# Patient Record
Sex: Female | Born: 1960 | Race: Asian | Hispanic: No | Marital: Married | Smoking: Never smoker
Health system: Western US, Academic
[De-identification: ages and names within clinical notes are randomized; demographics above are authoritative.]

## PROBLEM LIST (undated history)

## (undated) ENCOUNTER — Ambulatory Visit (HOSPITAL_BASED_OUTPATIENT_CLINIC_OR_DEPARTMENT_OTHER): Admission: RE | Payer: TRICARE Prime—HMO | Admitting: Gastroenterology

## (undated) ENCOUNTER — Encounter (HOSPITAL_BASED_OUTPATIENT_CLINIC_OR_DEPARTMENT_OTHER): Admission: RE | Payer: Self-pay

## (undated) SURGERY — COLONOSCOPY
Anesthesia: Moderate Sedation - by non-anesthesia staff only

---

## 2017-04-29 ENCOUNTER — Encounter (INDEPENDENT_AMBULATORY_CARE_PROVIDER_SITE_OTHER): Payer: Self-pay | Admitting: Family Medicine

## 2017-04-29 ENCOUNTER — Ambulatory Visit (INDEPENDENT_AMBULATORY_CARE_PROVIDER_SITE_OTHER): Payer: TRICARE Prime—HMO | Admitting: Family Medicine

## 2017-04-29 ENCOUNTER — Encounter: Payer: Self-pay | Admitting: Hospital

## 2017-04-29 VITALS — BP 102/65 | HR 66 | Temp 97.6°F | Ht <= 58 in | Wt 110.2 lb

## 2017-04-29 DIAGNOSIS — Z1331 Encounter for screening for depression: Secondary | ICD-10-CM

## 2017-04-29 DIAGNOSIS — Z1339 Encounter for screening examination for other mental health and behavioral disorders: Secondary | ICD-10-CM

## 2017-04-29 DIAGNOSIS — Z1239 Encounter for other screening for malignant neoplasm of breast: Principal | ICD-10-CM

## 2017-04-29 DIAGNOSIS — M5441 Lumbago with sciatica, right side: Principal | ICD-10-CM

## 2017-04-29 DIAGNOSIS — G8929 Other chronic pain: Principal | ICD-10-CM

## 2017-04-29 DIAGNOSIS — M5416 Radiculopathy, lumbar region: Secondary | ICD-10-CM

## 2017-04-29 DIAGNOSIS — Z0000 Encounter for general adult medical examination without abnormal findings: Secondary | ICD-10-CM

## 2017-04-29 DIAGNOSIS — Z1389 Encounter for screening for other disorder: Secondary | ICD-10-CM

## 2017-04-29 DIAGNOSIS — M8580 Other specified disorders of bone density and structure, unspecified site: Secondary | ICD-10-CM

## 2017-04-29 MED ORDER — MECLIZINE HCL 25 MG OR TABS: 25.00 mg | ORAL_TABLET | Freq: Three times a day (TID) | ORAL | Status: AC | PRN

## 2017-04-29 MED ORDER — ESOMEPRAZOLE MAGNESIUM 40 MG OR CPDR: 40.00 mg | DELAYED_RELEASE_CAPSULE | Freq: Every day | ORAL | Status: AC

## 2017-04-29 MED ORDER — HYDROXYZINE HCL 25 MG OR TABS: 25.00 mg | ORAL_TABLET | Freq: Three times a day (TID) | ORAL | Status: AC | PRN

## 2017-04-29 MED ORDER — ACYCLOVIR 800 MG OR TABS: 800.00 mg | ORAL_TABLET | Freq: Two times a day (BID) | ORAL | Status: AC

## 2017-04-29 MED ORDER — NAPROXEN 500 MG OR TABS
500.00 mg | ORAL_TABLET | Freq: Two times a day (BID) | ORAL | Status: DC | PRN
Start: ? — End: 2017-12-03

## 2017-04-29 MED ORDER — ALPRAZOLAM 0.25 MG OR TABS: .25 mg | ORAL_TABLET | Freq: Every evening | ORAL | Status: AC | PRN

## 2017-04-29 MED ORDER — ACETAMINOPHEN-CODEINE #3 300-30 MG OR TABS
1.00 | ORAL_TABLET | ORAL | Status: DC | PRN
Start: ? — End: 2017-06-25

## 2017-04-29 MED ORDER — PREDNISONE 20 MG OR TABS
20.00 mg | ORAL_TABLET | Freq: Every day | ORAL | Status: DC
Start: 2017-04-29 — End: 2017-06-25

## 2017-04-29 MED ORDER — HYDROCODONE-ACETAMINOPHEN 10-325 MG OR TABS
1.00 | ORAL_TABLET | Freq: Four times a day (QID) | ORAL | Status: DC | PRN
Start: ? — End: 2017-06-25

## 2017-04-29 MED ORDER — NITROFURANTOIN MONOHYD MACRO 100 MG OR CAPS
100.00 mg | ORAL_CAPSULE | Freq: Two times a day (BID) | ORAL | Status: DC
Start: 2017-04-29 — End: 2017-06-25

## 2017-04-29 MED ORDER — PREDNISONE 20 MG OR TABS
20.00 mg | ORAL_TABLET | Freq: Every day | ORAL | Status: DC
Start: ? — End: 2017-04-29

## 2017-04-29 MED ORDER — NITROFURANTOIN MONOHYD MACRO 100 MG OR CAPS
100.00 mg | ORAL_CAPSULE | Freq: Two times a day (BID) | ORAL | Status: DC
Start: ? — End: 2017-04-29

## 2017-04-29 NOTE — Telephone Encounter (Signed)
Forwarding to our LVN team to file online. Thank you!    +++++++++++++++++++++++++++++++++++++    Disability insurance claim filed with form receipt number: [id number].    Receipt number   Dates for disability(to and from): 06/29/2016  -  10/28/2017    Is patient under your care for this medical problem?  Yes.    Are you presently treating for this illness/injury?  Yes.     Treatment Intervals (weekly, monthly etc)?  2 times per week    Was patient seen by another MD or facility?  Yes.     First date of TX: 07/29/2016    Diagnosis: Chronic Lower Back Pain    ICD code: M54.41, G89.29    Permanent or Temporary?  Temporary    Any secondary diagnosis and code?  Chronic Radicular pain of lower back; M54.16; G89.29    Date hospitalized & Discharged?  No    Pt able to work prior to injury/ilness?  Yes.    Condition aggravated by pt's regular work?   Yes.     Is this for recommendation of drug or alchohol tx?  No.     Date pt became a resident of a treatment facility?  n/a    Would disclosure of this information be medically or psychologically detrimental to pt?  no    Pregnancy related?  no    Date of Delivery: no    EDD Receipt #: no    Vaginal or C-section?  no    Complicated/uncomplicated?  no    What day do you plan to start your disability?  04/29/2017    When do you plan to return to work?  10/29/2017

## 2017-04-29 NOTE — Telephone Encounter (Signed)
From: Marlon Peluyet Duong Lawley  To: Delbert PhenixLee, Robert Y, MD  Sent: 04/29/2017 11:39 AM PST  Subject: 20-Other    Disability insurance claim filed with form receipt number: [id number].

## 2017-04-29 NOTE — Patient Instructions (Addendum)
Please file disability on line and send us the Receipt Number/Claim Number.      Please get fasting blood tests at your convenience.

## 2017-04-29 NOTE — Progress Notes (Signed)
SUBJECTIVE:  Gail Lewis is a 57 year old female with chief complaint of Establish Care    Patient Active Problem List   Diagnosis    Chronic right-sided low back pain with right-sided sciatica    Osteopenia, unspecified location     Here with daughter.  First visit to our office.  Major concern is chronic lower back pain.  Has had since 2011.  No inciting injury or trauma.   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  Recently moved from [place] to [facility].   Previous PCP in [place] ordered L-spine films and 08/07/2016 report showed:  "1. Stable multilevel mild lumbar spondylosis, especially L3 through L5, and isolated L5-S1 degenerative disc compression.    2. No progressive or new developing vertebral compression."    Compared to 12/14/2012 L-spine radiographs per the above report.    She was ordered PT in [place], but moved here to [place] before PT could be started. Requesting PT order.    Lower back pain is described as diffuse, waxes and wanes, radiating down R leg at times; worse with walking and bending. Denies any weakness. Denies any falls. Denies any trauma.   Has been rx'd Norco by previous PCP for breakthrough pain, but she rarely takes this (per daughter, still using a bottle rx'd and filled in 2013).  Denies saddle anesthesia. Denies LE weakness/numbness/parasthesias. Denies bladder/bowel incontinence or retention.    Per daughter, has h/o migraine HA's and has been rx'd Tylenol #3 by PCP in [place]. However, she is not taking Tyl #3's recently, but has them if needed.     PMhx, PShx, MED, ALL, Shx, Fhx reviewed and in EPIC    ROS: as mentioned above  Denies fever/chills/sweats.  Denies URI s/sx.  Denies CP/SOB/N//V/diaphoresis.  Denies palpitations/orthopnea  Denies abd pain/n/v/d/c.  Denies melena/hematochezia.  Denies dysuria/hematuria.  Denies swelling/rash.  Denies any new or worsening motor-sensory changes.  Denies unintentional wt loss. Denies night  sweats.    Future Appointments   Date Time Provider Department Center   05/08/2017  8:30 AM [facility] DRAW STATION [facility] DRAW [facility]   06/25/2017 10:00 AM Delbert Phenix, [facility]     Current Outpatient Medications on File Prior to Visit   Medication Sig Dispense Refill    acetaminophen-codeine (TYLENOL #3) 300-30 MG tablet Take 1 tablet by mouth every 4 hours as needed for Moderate Pain (Pain Score 4-6).      acyclovir (ZOVIRAX) 800 MG tablet Take 800 mg by mouth 2 times daily.      ALPRAZolam (XANAX) 0.25 MG tablet Take 0.25 mg by mouth nightly as needed for Insomnia.      esomeprazole (NEXIUM) 40 MG capsule Take 40 mg by mouth every morning (before breakfast).      HYDROcodone-acetaminophen (NORCO) 10-325 MG tablet Take 1 tablet by mouth every 6 hours as needed for Moderate Pain (Pain Score 4-6).      hydrOXYzine HCL (ATARAX) 25 MG tablet Take 25 mg by mouth 3 times daily as needed for Itching.      meclizine (ANTIVERT) 25 MG tablet Take 25 mg by mouth every 8 hours as needed for Dizziness.      naproxen (NAPROSYN) 500 MG tablet Take 500 mg by mouth 2 times daily as needed. Take with meals.      [DISCONTINUED] nitrofurantoin monohydrate (MACROBID) 100 MG capsule Take 100 mg by mouth 2 times daily.      nitrofurantoin monohydrate (  MACROBID) 100 MG capsule Take 1 capsule (100 mg) by mouth 2 times daily. As needed for UTI 28 capsule     [DISCONTINUED] predniSONE (DELTASONE) 20 MG tablet Take 20 mg by mouth daily.      predniSONE (DELTASONE) 20 MG tablet Take 1 tablet (20 mg) by mouth daily. As needed for severe environmental allergies       No current facility-administered medications on file prior to visit.      Allergies as of 04/29/2017    (No Known Allergies)       There is no immunization history on file for this patient.    OBJECTIVE:  Vitals:    04/29/17 0941   BP: 102/65   BP Location: Left arm   BP Patient Position: Sitting   BP cuff size: Regular   Pulse: 66   Temp: 97.6 F (36.4 C)   TempSrc:  Tympanic   SpO2: 98%   Weight: 50 kg (110 lb 3.7 oz)   Height: 4\' 10"  (1.473 m)     Estimated body mass index is 23.04 kg/m as calculated from the following:    Height as of this encounter: 4\' 10"  (1.473 m).    Weight as of this encounter: 50 kg (110 lb 3.7 oz).  Gen: NAD, pleasant, comfortable  HEENT: NC/AT, anicteric sclera, EOMI, PERRL, OP normal and clear; no LAD;   Neck: full AROM without problem; no LAD; no thyromegaly or masses;  CV: RRR, S1+S2+, no M/R/G/C; no carotid bruits;  Resp: CTAB; no wheezes/rales; good air exchange  Abd: soft; NT/ND  Spine: non-focal diffuse mild ttp of lower back  Neuro: Neg Straight Leg raise; DTR of patella and achilles +2 b/l and symmetric; sensation of LE grossly intact but mild decrease in light sensation on R lateral calf region and mid-plantar region of R foot; MS+5/5 of all major BLE muscle groups and symmetric;   UE: No edema; no erythema/ ecchymosis/cyanosis; cap refill <2sec; normal radial pulses 2+ & symmetric;  LE: No edema; no erythema/ ecchymosis/cyanosis; cap refill <2sec; normal DP and PT pulses 2+ & symmetric; Neg Straight Leg raise;     Outside 08/07/2016 L-spine XR (Compared to 12/14/2012 L-spine radiographs done outside [place]):   "1. Stable multilevel mild lumbar spondylosis, especially L3 through L5, and isolated L5-S1 degenerative disc compression.    2. No progressive or new developing vertebral compression."        ASSESSMENT/PLAN:  Gail Lewis was seen today for establish care.    Diagnoses and all orders for this visit:    Chronic right-sided low back pain with right-sided sciatica;   Chronic radicular pain of lower back  - XR report as stated above  -     Physical Therapy - Outside (Non-[place])  - disability paperwork done today (questionnaire below)   - f/u in 2 mo or sooner PRN    Screened negative for depression    Screened negative for alcohol use    Screened negative for drug use    Osteopenia, unspecified location  - outside [facility]  - cont wt bearing and vit  D+[place]++ supplementation    Annual physical exam  -     Comprehensive Metabolic Panel Green; Future  -     Lipid Panel Green Plasma Separator Tube; Future  -     CBC w/ Diff Lavender; Future  -     Glycosylated Hgb(A1C), Blood Lavender; Future      - f/u in 2 mo or sooner PRN  Patient Instruction:  See progress note.   Barriers to Learning assessed.  Patient and/or caregiver understanding of teaching and instructions verified.    Medication Review:  Medications reviewed with patient and/or caregiver and medication list reconciled.  Over the counter medications, herbal therapies and supplements reviewed.  Patient and/or caregiver understanding and response to medications assessed.    Barriers to medications assessed and addressed.   Risks, benefits, alternatives to medications reviewed.      Education and Case Management:  Goals of management reviewed with patient and/or caregiver.   Barriers to achieving goals reviewed and addressed with patient and/or caregiver.       Updated medication list:  Current Outpatient Medications   Medication Sig    acetaminophen-codeine (TYLENOL #3) 300-30 MG tablet Take 1 tablet by mouth every 4 hours as needed for Moderate Pain (Pain Score 4-6).    acyclovir (ZOVIRAX) 800 MG tablet Take 800 mg by mouth 2 times daily.    ALPRAZolam (XANAX) 0.25 MG tablet Take 0.25 mg by mouth nightly as needed for Insomnia.    esomeprazole (NEXIUM) 40 MG capsule Take 40 mg by mouth every morning (before breakfast).    HYDROcodone-acetaminophen (NORCO) 10-325 MG tablet Take 1 tablet by mouth every 6 hours as needed for Moderate Pain (Pain Score 4-6).    hydrOXYzine HCL (ATARAX) 25 MG tablet Take 25 mg by mouth 3 times daily as needed for Itching.    meclizine (ANTIVERT) 25 MG tablet Take 25 mg by mouth every 8 hours as needed for Dizziness.    naproxen (NAPROSYN) 500 MG tablet Take 500 mg by mouth 2 times daily as needed. Take with meals.    nitrofurantoin monohydrate (MACROBID) 100 MG capsule  Take 1 capsule (100 mg) by mouth 2 times daily. As needed for UTI    predniSONE (DELTASONE) 20 MG tablet Take 1 tablet (20 mg) by mouth daily. As needed for severe environmental allergies     No current facility-administered medications for this visit.        Spent greater than 50 min with pt with greater than 50% of time spent on counseling and face-to-face.        ++++++++++++++++++++++++++++++++++++++++++++  Receipt number   Dates for disability(to and from): 06/29/2016  -  10/28/2017    Is patient under your care for this medical problem?  Yes.    Are you presently treating for this illness/injury?  Yes.     Treatment Intervals (weekly, monthly etc)?  2 times per week    Was patient seen by another MD or facility?  Yes.     First date of TX: 07/29/2016    Diagnosis: Chronic Lower Back Pain    ICD code: M54.41, G89.29    Permanent or Temporary?  Temporary    Any secondary diagnosis and code?  Chronic Radicular pain of lower back; M54.16; G89.29    Date hospitalized & Discharged?  No    Pt able to work prior to injury/ilness?  Yes.    Condition aggravated by pt's regular work?   Yes.     Is this for recommendation of drug or alchohol tx?  No.     Date pt became a resident of a treatment facility?  n/a    Would disclosure of this information be medically or psychologically detrimental to pt?  no    Pregnancy related?  no    Date of Delivery: no    EDD Receipt #: no    Vaginal or C-section?  no    Complicated/uncomplicated?  no    What day do you plan to start your disability?  04/29/2017    When do you plan to return to work?  10/29/2017

## 2017-04-29 NOTE — Interdisciplinary (Signed)
Pre-visit chart review and huddle completed with staff and physician.    Outstanding labs, imaging and consults reviewed and identified.    Health maintanence issues identified and addressed:    Health Maintenance   Topic Date Due    IMM_TD/TDAP=>57 YO  12/22/1972    PHQ2 depression screen  12/23/1979    CERVICAL CANCER SCREENING  12/23/1982    BREAST CANCER SCREENING  12/22/2001    COLON CANCER SCREENING WITH COLONOSCOPY  12/23/2011    Hepatitis C Screening  12/22/2013    INFLUENZA VACCINE  10/29/2016

## 2017-04-29 NOTE — Telephone Encounter (Signed)
Pt had appointment this morning.  

## 2017-04-30 NOTE — Telephone Encounter (Signed)
Disability completed online  Receipt # [id number]

## 2017-05-08 ENCOUNTER — Other Ambulatory Visit: Payer: TRICARE Prime—HMO | Attending: Family Medicine

## 2017-05-08 DIAGNOSIS — Z0000 Encounter for general adult medical examination without abnormal findings: Secondary | ICD-10-CM | POA: Insufficient documentation

## 2017-05-08 LAB — COMPREHENSIVE METABOLIC PANEL, BLOOD
ALT (SGPT): 10 U/L (ref 0–33)
AST (SGOT): 24 U/L (ref 0–32)
Albumin: 4.2 g/dL (ref 3.5–5.2)
Alkaline Phos: 97 U/L (ref 35–140)
Anion Gap: 11 mmol/L (ref 7–15)
BUN: 17 mg/dL (ref 6–20)
Bicarbonate: 27 mmol/L (ref 22–29)
Bilirubin, Tot: 0.25 mg/dL (ref <1.2)
Calcium: 9.3 mg/dL (ref 8.5–10.6)
Chloride: 104 mmol/L (ref 98–107)
Creatinine: 0.76 mg/dL (ref 0.51–0.95)
GFR: >60 mL/min
Glucose: 92 mg/dL (ref 70–99)
Potassium: 4.1 mmol/L (ref 3.5–5.1)
Sodium: 142 mmol/L (ref 136–145)
Total Protein: 6.5 g/dL (ref 6.0–8.0)

## 2017-05-08 LAB — CBC WITH DIFF, BLOOD
ANC-Automated: 1.7 10*3/uL (ref 1.6–7.0)
Abs Basophils: 0 10*3/uL (ref <0.1)
Abs Eosinophils: 0.1 10*3/uL (ref 0.1–0.5)
Abs Lymphs: 1.4 10*3/uL (ref 0.8–3.1)
Abs Monos: 0.2 10*3/uL (ref 0.2–0.8)
Basophils: 1 %
Eosinophils: 4 %
Hct: 36.7 % (ref 34.0–45.0)
Hgb: 11.9 gm/dL (ref 11.2–15.7)
Lymphocytes: 40 %
MCH: 28.3 pg (ref 26.0–32.0)
MCHC: 32.4 g/dL (ref 32.0–36.0)
MCV: 87.4 um3 (ref 79.0–95.0)
MPV: 10 fL (ref 9.4–12.4)
Monocytes: 7 %
Plt Count: 211 10*3/uL (ref 140–370)
RBC: 4.2 10*6/uL (ref 3.90–5.20)
RDW: 12.7 % (ref 12.0–14.0)
Segs: 48 %
WBC: 3.5 10*3/uL — ABNORMAL LOW (ref 4.0–10.0)

## 2017-05-08 LAB — LIPID(CHOL FRACT) PANEL, BLOOD
Cholesterol: 205 mg/dL (ref <200)
HDL-Cholesterol: 50 mg/dL
LDL-Chol (Calc): 136 mg/dL (ref <160)
Non-HDL Cholesterol: 155 mg/dL
Triglycerides: 95 mg/dL (ref 10–170)

## 2017-05-08 LAB — GLYCOSYLATED HGB(A1C), BLOOD: Glyco Hgb (A1C): 5 % (ref 4.8–5.8)

## 2017-05-08 NOTE — Interdisciplinary (Signed)
Blood drawn from left arm with 23 gauge needle. 3 tubes taken.   Patient identity authenticated by Michelle Banco.

## 2017-05-11 ENCOUNTER — Other Ambulatory Visit: Payer: Self-pay

## 2017-05-13 ENCOUNTER — Encounter: Payer: Self-pay | Admitting: Hospital

## 2017-05-21 ENCOUNTER — Telehealth (INDEPENDENT_AMBULATORY_CARE_PROVIDER_SITE_OTHER): Payer: Self-pay | Admitting: Family Medicine

## 2017-05-21 NOTE — Telephone Encounter (Signed)
Fax rec'd from [facility] placed in your inbox for your review.

## 2017-05-22 NOTE — Telephone Encounter (Signed)
Faxed and sent to scan on 05/22/2017 at 1:19pm

## 2017-05-22 NOTE — Telephone Encounter (Signed)
Completed & Signed and in my outbox. Please fax/send back. Thank you!

## 2017-05-27 ENCOUNTER — Encounter (INDEPENDENT_AMBULATORY_CARE_PROVIDER_SITE_OTHER): Payer: Self-pay | Admitting: Family Medicine

## 2017-05-27 DIAGNOSIS — Z1239 Encounter for other screening for malignant neoplasm of breast: Principal | ICD-10-CM

## 2017-06-25 ENCOUNTER — Encounter: Payer: Self-pay | Admitting: Hospital

## 2017-06-25 ENCOUNTER — Other Ambulatory Visit: Payer: TRICARE Prime—HMO | Attending: Family Medicine

## 2017-06-25 ENCOUNTER — Telehealth (INDEPENDENT_AMBULATORY_CARE_PROVIDER_SITE_OTHER): Payer: Self-pay

## 2017-06-25 ENCOUNTER — Ambulatory Visit (INDEPENDENT_AMBULATORY_CARE_PROVIDER_SITE_OTHER): Payer: TRICARE Prime—HMO | Admitting: Family Medicine

## 2017-06-25 ENCOUNTER — Encounter (INDEPENDENT_AMBULATORY_CARE_PROVIDER_SITE_OTHER): Payer: Self-pay | Admitting: Family Medicine

## 2017-06-25 VITALS — BP 117/78 | HR 70 | Temp 96.9°F | Resp 16 | Ht <= 58 in | Wt 112.4 lb

## 2017-06-25 DIAGNOSIS — F411 Generalized anxiety disorder: Secondary | ICD-10-CM

## 2017-06-25 DIAGNOSIS — Z0000 Encounter for general adult medical examination without abnormal findings: Secondary | ICD-10-CM | POA: Insufficient documentation

## 2017-06-25 DIAGNOSIS — L309 Dermatitis, unspecified: Secondary | ICD-10-CM

## 2017-06-25 DIAGNOSIS — Z1211 Encounter for screening for malignant neoplasm of colon: Secondary | ICD-10-CM

## 2017-06-25 DIAGNOSIS — F320 Major depressive disorder, single episode, mild: Secondary | ICD-10-CM

## 2017-06-25 DIAGNOSIS — E7800 Pure hypercholesterolemia, unspecified: Secondary | ICD-10-CM

## 2017-06-25 DIAGNOSIS — Z1159 Encounter for screening for other viral diseases: Secondary | ICD-10-CM | POA: Insufficient documentation

## 2017-06-25 DIAGNOSIS — M5442 Lumbago with sciatica, left side: Principal | ICD-10-CM

## 2017-06-25 DIAGNOSIS — H9193 Unspecified hearing loss, bilateral: Secondary | ICD-10-CM

## 2017-06-25 DIAGNOSIS — G8929 Other chronic pain: Principal | ICD-10-CM

## 2017-06-25 DIAGNOSIS — Z23 Encounter for immunization: Secondary | ICD-10-CM

## 2017-06-25 LAB — HEPATITIS C AB, BLOOD: Hepatitis C Ab: NONREACTIVE

## 2017-06-25 MED ORDER — HYDROCORTISONE 1 % EX CREA
1.00 | TOPICAL_CREAM | Freq: Two times a day (BID) | CUTANEOUS | 1 refills | Status: DC | PRN
Start: 2017-06-25 — End: 2017-06-25

## 2017-06-25 MED ORDER — SERTRALINE HCL 25 MG OR TABS
25.0000 mg | ORAL_TABLET | Freq: Every day | ORAL | 3 refills | Status: DC
Start: 2017-06-25 — End: 2017-06-25

## 2017-06-25 MED ORDER — SERTRALINE HCL 25 MG OR TABS
25.00 mg | ORAL_TABLET | Freq: Every day | ORAL | 3 refills | Status: DC
Start: 2017-06-25 — End: 2018-02-04

## 2017-06-25 MED ORDER — HYDROCORTISONE 1 % EX CREA
1.00 | TOPICAL_CREAM | Freq: Two times a day (BID) | CUTANEOUS | 1 refills | Status: AC | PRN
Start: 2017-06-25 — End: ?

## 2017-06-25 NOTE — Patient Instructions (Addendum)
1) Please bring all your medication bottles with you to your next appointment with me.     2) Please return for a PAP smear.    3) Please make an appointment for a mammogram (breast cancer screening). Instructions below:   # Please call  Imaging Scheduling at [phone] to schedule your appointment at your earliest convenience. In order to obtain the most accurate insurance approval, we secure authorization after you are registered and scheduled for imaging services. Please have your insurance cards available when scheduling your exam.    4) Please get Lower Back Xray.          My name is Marcelino DusterMichelle,      It was a pleasure assisting you today. If you have any questions please feel free to call us at [phone].     "We are committed to making sure our patients receive the best care.  We mail and e-mail surveys randomly to patients after their visits.  If you happen to get one, we'd appreciate you taking a few minutes to fill it out to let us know what you liked best about your visit, and anything we can improve.  We use that information to make the care even better for you, as well as recognize staff members who do a great job."    Thank you for choosing  for your healthcare needs and have a great day!

## 2017-06-25 NOTE — Progress Notes (Signed)
SUBJECTIVE:  Gail Lewis is a 57 year old female with chief complaint of Follow Up (for back pain )    Patient Active Problem List   Diagnosis    Chronic right-sided low back pain with right-sided sciatica    Osteopenia, unspecified location     Second visit to our office.   Here for f/u back pain. Here today with husband.   Feels better after PT for 2-3 hours, but then lower back discomfort returns.   Radiates down LLE. Has about 2 session left of PT. Open to continuing PT if they recommend continuing with additional sessions.   Denies saddle anesthesia. Denies LE weakness/numbness/parasthesias. Denies bladder/bowel incontinence or retention.    2015 audiogram from [place], [place], showing hearing loss, purchased hearing aids 2 years ago. Sometimes uses it.   Desiring continuing care and evaluation at [place]. Denies any new s/sx. Denies dizziness/vertigo/HA/falls.    PMhx, PShx, MED, ALL, Shx, Fhx reviewed and in EPIC    ROS: as mentioned above  Denies fever/chills/sweats.  Denies URI s/sx.  Denies HA/lightheaded/dizziness.  Denies CP/SOB/N//V/diaphoresis.  Denies palpitations/orthopnea  Denies abd pain/n/v/d/c.  Denies melena/hematochezia.  Denies dysuria/hematuria.  Denies swelling/rash.  Denies visual changes/facial droop/confusion.  Denies focal weakness/numbness/parasthesias.  Denies any new or worsening motor-sensory changes.  Denies unintentional wt loss. Denies night sweats.    Future Appointments   Date Time Provider Department Center   07/07/2017  3:20 PM [facility] [address]   08/26/2017  2:00 PM Delbert PhenixLee, Tysheena Ginzburg Y[facility]   09/01/2017 10:00 AM Z[facility]     Current Outpatient Medications on File Prior to Visit   Medication Sig Dispense Refill    acyclovir (ZOVIRAX) 800 MG tablet Take 800 mg by mouth 2 times daily.      ALPRAZolam (XANAX) 0.25 MG tablet Take 0.25 mg by mouth nightly as needed for Insomnia.      esomeprazole (NEXIUM) 40 MG capsule Take 40  mg by mouth every morning (before breakfast).      hydrOXYzine HCL (ATARAX) 25 MG tablet Take 25 mg by mouth 3 times daily as needed for Itching.      meclizine (ANTIVERT) 25 MG tablet Take 25 mg by mouth every 8 hours as needed for Dizziness.      naproxen (NAPROSYN) 500 MG tablet Take 500 mg by mouth 2 times daily as needed. Take with meals.       No current facility-administered medications on file prior to visit.      Allergies as of 06/25/2017    (No Known Allergies)     Immunization History   Administered Date(s) Administered    Tdap 06/25/2017       OBJECTIVE:  Vitals:    06/25/17 1014   BP: 117/78   BP Location: Left arm   BP Patient Position: Sitting   BP cuff size: Regular   Pulse: 70   Resp: 16   Temp: 96.9 F (36.1 C)   TempSrc: Tympanic   SpO2: 97%   Weight: 51 kg (112 lb 6.4 oz)   Height: 4\' 10"  (1.473 m)     Estimated body mass index is 23.49 kg/m as calculated from the following:    Height as of this encounter: 4\' 10"  (1.473 m).    Weight as of this encounter: 51 kg (112 lb 6.4 oz).  Gen: NAD, pleasant, comfortable  HEENT: NC/AT  Neck: full AROM without problem; no LAD; no thyromegaly  or masses;  CV: RRR, S1+S2+, no M/R/G/C; no carotid bruits;  Resp: CTAB; no wheezes/rales; good air exchange  Abd: soft; NT/ND  Spine: no ttp; full AROM; + tight lumbar paraspinal muscles  UE: No edema; no erythema/ ecchymosis/cyanosis; cap refill <2sec; normal radial pulses 2+ & symmetric;  LE: No edema; no erythema/ ecchymosis/cyanosis; cap refill <2sec; normal DP and PT pulses 2+ & symmetric; neg straight leg raise; full AROM of all major joints; able to completely bend knees in baseball-catcher (or squatting) stance without problem; gait stable        Results for orders placed or performed in visit on 05/08/17   Glycosylated Hgb(A1C), Blood Lavender   Result Value Ref Range    Glyco Hgb (A1C) 5.0 4.8 - 5.8 %   CBC w/ Diff Lavender   Result Value Ref Range    WBC 3.5 (L) 4.0 - 10.0 1000/mm3    RBC 4.20 3.90 -  5.20 mill/mm3    Hgb 11.9 11.2 - 15.7 gm/dL    Hct 16.1 09.6 - 04.5 %    MCV 87.4 79.0 - 95.0 um3    MCH 28.3 26.0 - 32.0 pgm    MCHC 32.4 32.0 - 36.0 g/dL    RDW 40.9 81.1 - 91.4 %    MPV 10.0 9.4 - 12.4 fL    Plt Count 211 140 - 370 1000/mm3    Segs 48 %    Lymphocytes 40 %    Monocytes 7 %    Eosinophils 4 %    Basophils 1 %    ANC-Automated 1.7 1.6 - 7.0 1000/mm3    Abs Lymphs 1.4 0.8 - 3.1 1000/mm3    Abs Monos 0.2 0.2 - 0.8 1000/mm3    Abs Eosinophils 0.1 <0.1 - 0.5 1000/mm3    Abs Basophils 0.0 <0.1 1000/mm3    Diff Type Automated    Lipid Panel Green Plasma Separator Tube   Result Value Ref Range    Cholesterol 205 <200 mg/dL    HDL-Cholesterol 50 mg/dL    LDL-Chol (Calc) 782 <160 mg/dL    Non-HDL Cholesterol 155 mg/dL    Triglycerides 95 10 - 170 mg/dL   Comprehensive Metabolic Panel Green   Result Value Ref Range    Glucose 92 70 - 99 mg/dL    BUN 17 6 - 20 mg/dL    Creatinine 9.56 2.13 - 0.95 mg/dL    GFR >08 mL/min    Sodium 142 136 - 145 mmol/L    Potassium 4.1 3.5 - 5.1 mmol/L    Chloride 104 98 - 107 mmol/L    Bicarbonate 27 22 - 29 mmol/L    Anion Gap 11 7 - 15 mmol/L    Calcium 9.3 8.5 - 10.6 mg/dL    Total Protein 6.5 6.0 - 8.0 g/dL    Albumin 4.2 3.5 - 5.2 g/dL    Bilirubin, Tot 6.57 <1.2 mg/dL    AST (SGOT) 24 0 - 32 U/L    ALT (SGPT) 10 0 - 33 U/L    Alkaline Phos 97 35 - 140 U/L     The 10-year ASCVD risk score Denman George DC Jr., et al., 2013) is: 1.9%    Values used to calculate the score:      Age: 29 years      Sex: Female      Is Non-Hispanic African American: No      Diabetic: No      Tobacco smoker: No  Systolic Blood Pressure: 117 mmHg      Is BP treated: No      HDL Cholesterol: 50 mg/dL      Total Cholesterol: 205 mg/dL      Low Risk: 0 - 5 %  Moderate Risk: 5 - 7.5 %  High Risk: 7.5 - 100 %        ASSESSMENT/PLAN:  Gail Lewis was seen today for follow up.    Diagnoses and all orders for this visit:    Chronic left-sided low back pain with left-sided sciatica: improved but still present with  PT; to finish PT; obtain new XR; if persists, will consider MRI imaging  -     X-Ray Lumbosacral Spine 2 Or 3 Views    GAD (generalized anxiety disorder): well controlled; cont sertaline  -     sertraline (ZOLOFT) 25 MG tablet; Take 1 tablet (25 mg) by mouth daily.    Current mild episode of major depressive disorder, unspecified whether recurrent (CMS-HCC): well controlled; cont sertaline  -     sertraline (ZOLOFT) 25 MG tablet; Take 1 tablet (25 mg) by mouth daily.    Bilateral hearing loss, unspecified hearing loss type: h/o hearing aids; previously managed in [place]; will re-evaluate hearing per pt request  -     Audiology Clinic  -     Consult/Referral to Audiogram    Need for vaccination  -     Tdap (BOOSTRIX/ADACEL) for patients 43-10 years old    Colon cancer screening  -     GI Endoscopy Procedure Service Request (Non-GI Use Only); Future    Annual physical exam  -     GI Endoscopy Procedure Service Request (Non-GI Use Only); Future  -     Hepatitis C Antibody Yellow serum separator tube; Future  - to return for PAP screening  - mammogram ordered    Need for hepatitis C screening test  -     Hepatitis C Antibody Yellow serum separator tube; Future    Eczema, unspecified type: on-off eczema of neck region; no acute eczema today  -     hydrocortisone 1 % cream; Apply 1 Application topically 2 times daily as needed (neck rash). No longer than 2 weeks straight    Elevated cholesterol:   - Discussed diet & exercise at great length today and pt is agreeable and motivated      F/u in 2 mo for above or sooner PRN      Patient Instruction:  See progress note.   Barriers to Learning assessed.  Patient and/or caregiver understanding of teaching and instructions verified.    Medication Review:  Medications reviewed with patient and/or caregiver and medication list reconciled.  Over the counter medications, herbal therapies and supplements reviewed.  Patient and/or caregiver understanding and response to medications  assessed.    Barriers to medications assessed and addressed.   Risks, benefits, alternatives to medications reviewed.      Education and Case Management:  Goals of management reviewed with patient and/or caregiver.   Barriers to achieving goals reviewed and addressed with patient and/or caregiver.       Updated medication list:  Current Outpatient Medications   Medication Sig    acyclovir (ZOVIRAX) 800 MG tablet Take 800 mg by mouth 2 times daily.    ALPRAZolam (XANAX) 0.25 MG tablet Take 0.25 mg by mouth nightly as needed for Insomnia.    esomeprazole (NEXIUM) 40 MG capsule Take 40 mg by mouth every morning (before  breakfast).    hydrocortisone 1 % cream Apply 1 Application topically 2 times daily as needed (neck rash). No longer than 2 weeks straight    hydrOXYzine HCL (ATARAX) 25 MG tablet Take 25 mg by mouth 3 times daily as needed for Itching.    meclizine (ANTIVERT) 25 MG tablet Take 25 mg by mouth every 8 hours as needed for Dizziness.    naproxen (NAPROSYN) 500 MG tablet Take 500 mg by mouth 2 times daily as needed. Take with meals.    sertraline (ZOLOFT) 25 MG tablet Take 1 tablet (25 mg) by mouth daily.     No current facility-administered medications for this visit.

## 2017-06-25 NOTE — Interdisciplinary (Signed)
Verified Orders and vials/syringes for Immunizations/Medications prior to M.A. administration.

## 2017-06-25 NOTE — Telephone Encounter (Signed)
Received call from patient requesting to schedule colonoscopy. Patient received an order today 06/25/17 from Dr. Nedra HaiLee. Please advise for scheduling purposes. Thank you

## 2017-06-25 NOTE — Interdisciplinary (Signed)
Blood drawn from left arm with 21 gauge needle. 1 tubes taken.   Patient identity authenticated by DLV

## 2017-06-26 ENCOUNTER — Telehealth (INDEPENDENT_AMBULATORY_CARE_PROVIDER_SITE_OTHER): Payer: Self-pay | Admitting: Family Medicine

## 2017-06-26 NOTE — Telephone Encounter (Signed)
Patient came in with Husband for office visit and brought in prescriptions that were misplaced yesterday after giving them a 2nd copy. Sent prescriptions to shred.

## 2017-06-30 ENCOUNTER — Encounter: Payer: Self-pay | Admitting: Hospital

## 2017-06-30 ENCOUNTER — Telehealth (INDEPENDENT_AMBULATORY_CARE_PROVIDER_SITE_OTHER): Payer: Self-pay | Admitting: Family Medicine

## 2017-06-30 NOTE — Telephone Encounter (Signed)
Forms received from [facility] [place] (progress note) . Placed in providers inbox for review.

## 2017-07-01 NOTE — Telephone Encounter (Signed)
Triage: Colonoscopy, routine screening, any GI MD, LJ/HC

## 2017-07-02 NOTE — Telephone Encounter (Signed)
Called patient to assist with scheduling procedure. Left voicemail message for patient to return our call. If patient calls back please assist patient in scheduling procedure as triaged below. Thank You    Triage: Colonoscopy, routine screening, any GI MD, LJ/HC

## 2017-07-03 ENCOUNTER — Encounter (INDEPENDENT_AMBULATORY_CARE_PROVIDER_SITE_OTHER): Payer: Self-pay | Admitting: Family Medicine

## 2017-07-03 ENCOUNTER — Other Ambulatory Visit (INDEPENDENT_AMBULATORY_CARE_PROVIDER_SITE_OTHER): Payer: Self-pay | Admitting: Gastroenterology

## 2017-07-03 DIAGNOSIS — Z1211 Encounter for screening for malignant neoplasm of colon: Secondary | ICD-10-CM

## 2017-07-03 DIAGNOSIS — Z1239 Encounter for other screening for malignant neoplasm of breast: Principal | ICD-10-CM

## 2017-07-03 MED ORDER — PEG 3350-KCL-NABCB-NACL-NASULF 236 GM OR SOLR
4.0000 L | Freq: Once | ORAL | 0 refills | Status: AC
Start: 2017-07-03 — End: 2017-07-03

## 2017-07-03 NOTE — Telephone Encounter (Signed)
Bowel prep pended and routed to provider for review, Thank you.

## 2017-07-06 ENCOUNTER — Other Ambulatory Visit (INDEPENDENT_AMBULATORY_CARE_PROVIDER_SITE_OTHER): Payer: TRICARE Prime—HMO

## 2017-07-06 DIAGNOSIS — M8939 Hypertrophy of bone, multiple sites: Secondary | ICD-10-CM

## 2017-07-06 DIAGNOSIS — M5137 Other intervertebral disc degeneration, lumbosacral region: Secondary | ICD-10-CM

## 2017-07-07 ENCOUNTER — Ambulatory Visit (INDEPENDENT_AMBULATORY_CARE_PROVIDER_SITE_OTHER): Payer: TRICARE Prime—HMO

## 2017-07-13 ENCOUNTER — Other Ambulatory Visit (INDEPENDENT_AMBULATORY_CARE_PROVIDER_SITE_OTHER): Payer: Self-pay | Admitting: Gastroenterology

## 2017-07-14 ENCOUNTER — Encounter (INDEPENDENT_AMBULATORY_CARE_PROVIDER_SITE_OTHER): Payer: Self-pay | Admitting: Internal Medicine

## 2017-07-14 DIAGNOSIS — Z0000 Encounter for general adult medical examination without abnormal findings: Secondary | ICD-10-CM

## 2017-07-20 ENCOUNTER — Telehealth (INDEPENDENT_AMBULATORY_CARE_PROVIDER_SITE_OTHER): Payer: Self-pay | Admitting: Gastroenterology

## 2017-07-20 NOTE — Telephone Encounter (Signed)
Pre-procedure telephone call made. Left voice message with appointment date, time, and location. Patient will need transportation post procedure, will need to pick up bowel prep from the pharmacy (if applicable).  If any questions or needs to speak to a nurse, patient can call us back at [phone].

## 2017-07-31 ENCOUNTER — Encounter (INDEPENDENT_AMBULATORY_CARE_PROVIDER_SITE_OTHER): Payer: Self-pay | Admitting: Family Medicine

## 2017-07-31 DIAGNOSIS — Z1239 Encounter for other screening for malignant neoplasm of breast: Secondary | ICD-10-CM

## 2017-08-06 ENCOUNTER — Telehealth (INDEPENDENT_AMBULATORY_CARE_PROVIDER_SITE_OTHER): Payer: Self-pay | Admitting: Family Medicine

## 2017-08-06 NOTE — Telephone Encounter (Signed)
Epic HM updated.    Records reviewed. Please scan into EPIC. In my outbox.Thank you!

## 2017-08-06 NOTE — Telephone Encounter (Signed)
Records were received from providers outbox and placed in pile for scan. No further actions needed, closing encounter.

## 2017-08-06 NOTE — Telephone Encounter (Signed)
Fax received from From [facility] (Mammogram results). Placed in providers inbox for review/signature if indicated. Once completed please forward back to pool so we can finish encounter if needed.    Thank you!

## 2017-08-13 ENCOUNTER — Encounter (INDEPENDENT_AMBULATORY_CARE_PROVIDER_SITE_OTHER): Payer: TRICARE Prime—HMO | Admitting: Family Medicine

## 2017-08-26 ENCOUNTER — Encounter (INDEPENDENT_AMBULATORY_CARE_PROVIDER_SITE_OTHER): Payer: TRICARE Prime—HMO | Admitting: Family Medicine

## 2017-09-01 ENCOUNTER — Ambulatory Visit (INDEPENDENT_AMBULATORY_CARE_PROVIDER_SITE_OTHER): Payer: TRICARE Prime—HMO | Admitting: Audiology

## 2017-09-01 DIAGNOSIS — H903 Sensorineural hearing loss, bilateral: Secondary | ICD-10-CM

## 2017-09-02 ENCOUNTER — Encounter (INDEPENDENT_AMBULATORY_CARE_PROVIDER_SITE_OTHER): Payer: Self-pay | Admitting: Family Medicine

## 2017-09-02 ENCOUNTER — Ambulatory Visit (INDEPENDENT_AMBULATORY_CARE_PROVIDER_SITE_OTHER): Payer: TRICARE Prime—HMO | Admitting: Family Medicine

## 2017-09-02 VITALS — BP 105/67 | HR 53 | Temp 97.7°F | Resp 15 | Ht <= 58 in | Wt 105.6 lb

## 2017-09-02 DIAGNOSIS — B002 Herpesviral gingivostomatitis and pharyngotonsillitis: Secondary | ICD-10-CM

## 2017-09-02 DIAGNOSIS — M5441 Lumbago with sciatica, right side: Principal | ICD-10-CM

## 2017-09-02 DIAGNOSIS — G8929 Other chronic pain: Principal | ICD-10-CM

## 2017-09-02 MED ORDER — VALACYCLOVIR HCL 1000 MG OR TABS
ORAL_TABLET | ORAL | 3 refills | Status: AC
Start: 2017-09-02 — End: ?

## 2017-09-02 MED ORDER — GABAPENTIN 300 MG OR CAPS
300.0000 mg | ORAL_CAPSULE | Freq: Three times a day (TID) | ORAL | 3 refills | Status: AC
Start: 2017-09-02 — End: ?

## 2017-09-02 NOTE — Progress Notes (Signed)
SUBJECTIVE:  Gail Lewis is a 57 year old female with chief complaint of Follow Up (Chronic left-sided low back pain with left-sided sciatica )    Patient Active Problem List   Diagnosis    Chronic right-sided low back pain with right-sided sciatica    Osteopenia, unspecified location     Lower back pain is described as diffuse, waxes and wanes, radiating down R leg at times; worse with walking and bending. Denies any weakness. Denies any falls. Denies any trauma.   Has been rx'd Norco by previous PCP for breakthrough pain, but she rarely takes this (per daughter, still using a bottle rx'd and filled in 2013).  Denies saddle anesthesia. Denies LE weakness/numbness/parasthesias. Denies bladder/bowel incontinence or retention.    +++++++++++++++++++++++++++++++    Completed PT and still with symptoms.    07/06/2017 XR lumbar spine shows:  +++++++++++++++++++++++++++++++++++++++++++++  EXAM DESCRIPTION:  X-RAY LUMBOSACRAL SPINE MINIMUM 4 VIEWS    CLINICAL HISTORY:  Chronic lower back pain with radiation to left.    TECHNIQUE:  5 images.    COMPARISON:  None    FINDINGS:  There are 5 lumbar-type vertebral bodies.    Vertebral body heights are preserved. No acute fracture is seen. Spinal alignment is normal. There is mild multilevel degenerative disc disease, most marked at L5-S1. There is mild to moderate facet arthrosis at L4-L5 and L5-S1.    Bones are demineralized. Sacroiliac joints and pubic symphysis are congruent. Hip joint spaces are preserved.    CONCURRENT SUPERVISION:  I have reviewed the images and agree with the fellow's report.        Preliminary created by: Fuller PlanWilson, Lin   Signed by: Roanna RaiderHughes, Tudor 07/06/2017 12:20:10   Impression     IMPRESSION:  No acute osseous injury.    Mild multilevel degenerative changes of the lumbar spine, most marked at L5-S1.     +++++++++++++++++++++++++++++++++++++++++++++      PMhx, PShx, MED, ALL, Shx, Fhx reviewed and in EPIC    ROS: as mentioned above  Denies  fever/chills/sweats.  Denies URI s/sx.  Denies HA/lightheaded/dizziness.  Denies CP/SOB/N//V/diaphoresis.  Denies palpitations/orthopnea  Denies abd pain/n/v/d/c.  Denies melena/hematochezia.  Denies dysuria/hematuria.  Denies swelling. + cold sore on lip; h/o of this chronically; desires treatment options.  Denies any new or worsening motor-sensory changes.  Denies unintentional wt loss. Denies night sweats.    Future Appointments   Date Time Provider Department Center   11/18/2017  8:20 AM Delbert PhenixLee, Zula Hovsepian Y, MD [facility]     Current Outpatient Medications on File Prior to Visit   Medication Sig Dispense Refill    acyclovir (ZOVIRAX) 800 MG tablet Take 800 mg by mouth 2 times daily.      ALPRAZolam (XANAX) 0.25 MG tablet Take 0.25 mg by mouth nightly as needed for Insomnia.      esomeprazole (NEXIUM) 40 MG capsule Take 40 mg by mouth every morning (before breakfast).      hydrocortisone 1 % cream Apply 1 Application topically 2 times daily as needed (neck rash). No longer than 2 weeks straight 1 Tube 1    hydrOXYzine HCL (ATARAX) 25 MG tablet Take 25 mg by mouth 3 times daily as needed for Itching.      meclizine (ANTIVERT) 25 MG tablet Take 25 mg by mouth every 8 hours as needed for Dizziness.      naproxen (NAPROSYN) 500 MG tablet Take 500 mg by mouth 2 times daily as needed. Take with meals.  sertraline (ZOLOFT) 25 MG tablet Take 1 tablet (25 mg) by mouth daily. 90 tablet 3     No current facility-administered medications on file prior to visit.      Allergies as of 09/02/2017    (No Known Allergies)     Immunization History   Administered Date(s) Administered    Tdap 06/25/2017       OBJECTIVE:  Vitals:    09/02/17 1148   BP: 105/67   BP Location: Right arm   BP Patient Position: Sitting   BP cuff size: Regular   Pulse: 53   Resp: 15   Temp: 97.7 F (36.5 C)   TempSrc: Oral   SpO2: 98%   Weight: (!) 47.9 kg (105 lb 9.6 oz)   Height: 4\' 10"  (1.473 m)     Estimated body mass index is 22.07 kg/m as  calculated from the following:    Height as of this encounter: 4\' 10"  (1.473 m).    Weight as of this encounter: 47.9 kg (105 lb 9.6 oz).  Gen: NAD, pleasant, comfortable  HEENT: NC/AT, + lip cold sore  Neck: full AROM without problem  CV: RRR, S1+S2+, no M/R/G/C;  Resp: CTAB; no wheezes/rales; good air exchange  Abd: soft; NT/ND  Spine: o spinal tenderness; normal paraspinal muscles  Neuro: CN2-12 intact grossly; normal gait; MS +5/5 b/l; sensation grossly intact; DTR 2+ b/l; b/l straight leg raise  UE: No edema;  LE: No edema; no erythema/ ecchymosis/cyanosis; no skin lesions; no induration / fluctuance; no discharge; full AROM and PROM of hip, knee, ankle and toes; cap refill <2sec; normal DP and PT pulses 2+ & symmetric; sensation grossly intact; no bony ttp; no joint line ttp;     Results for orders placed or performed in visit on 06/25/17   Hepatitis C Antibody Yellow serum separator tube   Result Value Ref Range    Hepatitis C Ab Non Reactive        ASSESSMENT/PLAN:  Gail Lewis was seen today for follow up.    Diagnoses and all orders for this visit:    Chronic right-sided low back pain with right-sided sciatica: failed PT; XR completed and noted above; will obtain MRI and have pt see our pain management clinic for discussion of options for pain management; will start gabapentin trial and pt verbalizes desire to try  -     MRI Lumbar Spine W/O Contrast; Future  -     gabapentin (NEURONTIN) 300 MG capsule; Take 1 capsule (300 mg) by mouth 3 times daily.  -     Consult/Referral to Pain Clinic    Primary HSV infection of mouth: onset within 48 hr; discussed topical and supportive options; pt and husband would like to try PO medication; aware of risks and benefits and verbalizes understanding  -     valACYclovir (VALTREX) 1 GM tablet; Two tablets (2 grams) twice daily for 1 day within 48 hours of cold sore starting    F/u in ~2 mo and PRN before then    Patient Instruction:  See progress note.   Barriers to Learning  assessed.  Patient and/or caregiver understanding of teaching and instructions verified.    Medication Review:  Medications reviewed with patient and/or caregiver and medication list reconciled.  Over the counter medications, herbal therapies and supplements reviewed.  Patient and/or caregiver understanding and response to medications assessed.    Barriers to medications assessed and addressed.   Risks, benefits, alternatives to medications reviewed.  Education and Case Management:  Goals of management reviewed with patient and/or caregiver.   Barriers to achieving goals reviewed and addressed with patient and/or caregiver.       Updated medication list:  Current Outpatient Medications   Medication Sig    acyclovir (ZOVIRAX) 800 MG tablet Take 800 mg by mouth 2 times daily.    ALPRAZolam (XANAX) 0.25 MG tablet Take 0.25 mg by mouth nightly as needed for Insomnia.    esomeprazole (NEXIUM) 40 MG capsule Take 40 mg by mouth every morning (before breakfast).    gabapentin (NEURONTIN) 300 MG capsule Take 1 capsule (300 mg) by mouth 3 times daily.    hydrocortisone 1 % cream Apply 1 Application topically 2 times daily as needed (neck rash). No longer than 2 weeks straight    hydrOXYzine HCL (ATARAX) 25 MG tablet Take 25 mg by mouth 3 times daily as needed for Itching.    meclizine (ANTIVERT) 25 MG tablet Take 25 mg by mouth every 8 hours as needed for Dizziness.    naproxen (NAPROSYN) 500 MG tablet Take 500 mg by mouth 2 times daily as needed. Take with meals.    sertraline (ZOLOFT) 25 MG tablet Take 1 tablet (25 mg) by mouth daily.    valACYclovir (VALTREX) 1 GM tablet Two tablets (2 grams) twice daily for 1 day within 48 hours of cold sore starting     No current facility-administered medications for this visit.

## 2017-09-02 NOTE — Patient Instructions (Addendum)
1) Please continue home back exercises  2) Please obtain MRI  3) After MRI, please see the pain specialists (referral information below); call to make an appointment after you schedule the MRI

## 2017-09-03 NOTE — Progress Notes (Signed)
Audiologic Evaluation  Audiogram Viewable in Media    History  Patient referred by Rodena Medinobert Lee, MD, for an updated audiogram. Partial audiogram from outside clinic on 07/12/13 showed air conduction thresholds mild/moderate to profound at [phone] with no test results provided above 1000 Hz. Pt reports ear issues since childhood, was told she has holes in both ear drums and so wont benefit from hearing aids. Patient stated she has difficulty with understanding speech without facial cues, cannot hear alarms, cars behind her, birds or her phone ringing, and can only use her phone at full volume with her left ear. Patients primary language is [place].  She communicated well in English appointment with lip reading and speaking English with strong accent.      Results  Pure tone testing for the right ear showed a moderately-severe essentially sensorineural hearing loss 125-250, sloping from severe at 500 to profound hearing loss [phone], with no responses at 2000-8000 Hz. Left ear testing showed a moderately-severe sensorineural hearing loss 125-500, sloping from severe at 1000-2000 to profound (with no responses) at 3000-8000Hz  . Word recognition score was very poor bilaterally, with 36% for the right ear and 40% for the left ear. Note: word recognition was difficult to score strong accent.    Immittance testing showed a type A (normal) tympanogram for the right ear and type As (shallow compliance) for the left ear, with normal ear canal volume bilaterally (no perforation).    Impression  Results indicate bilateral asymmetrical moderately-severe to profound sensorineural hearing loss with right side15-30 dB poorer than left.  Compared to partial results from 07/12/13, right ear worsened by 25-30dB and left ear was within 10-20 dB.     This patient is expected to require visual cues for communication with very little audibility for average level speech. The patient uses speech reading very well, particularly for  [place].   Discussed possible benefits of hearing aids or cochlear implants and she indicated interest in hearing aid trial.     Patient may have benefit for hearing aids through [facility]. Explained we will submit a DME request to determine this. Patient would like to be called and left a voicemail with result.     Plan  Will submit DME request to insurance to determine hearing aid.  Patient to be called and left a voicemail regarding benefits  Patient will schedule hearing aid evaluation, hearing aid fitting, and follow-up appointments depending on insurance benefit.      Paulino Doorarlee Michaelson, BA  Audiology Doctoral Student  Leona SingletonErika Rajanae Mantia, PhD CCC-A   Clinical Professor, Audiology  Dispensing Audiologist [phone]

## 2017-09-04 ENCOUNTER — Encounter: Payer: Self-pay | Admitting: Hospital

## 2017-10-20 ENCOUNTER — Inpatient Hospital Stay (INDEPENDENT_AMBULATORY_CARE_PROVIDER_SITE_OTHER)
Admit: 2017-10-20 | Discharge: 2017-10-20 | Disposition: A | Discharge status: Home Health | Payer: TRICARE Prime—HMO | Attending: Family Medicine | Admitting: Family Medicine

## 2017-10-20 DIAGNOSIS — G8929 Other chronic pain: Principal | ICD-10-CM

## 2017-10-20 DIAGNOSIS — M5441 Lumbago with sciatica, right side: Principal | ICD-10-CM

## 2017-10-26 ENCOUNTER — Encounter (HOSPITAL_BASED_OUTPATIENT_CLINIC_OR_DEPARTMENT_OTHER): Payer: Self-pay | Admitting: Anesthesiology

## 2017-10-26 ENCOUNTER — Ambulatory Visit: Payer: TRICARE Prime—HMO | Attending: Anesthesiology | Admitting: Anesthesiology

## 2017-10-26 VITALS — BP 118/78 | HR 69

## 2017-10-26 DIAGNOSIS — G8929 Other chronic pain: Secondary | ICD-10-CM | POA: Insufficient documentation

## 2017-10-26 DIAGNOSIS — M5441 Lumbago with sciatica, right side: Secondary | ICD-10-CM | POA: Insufficient documentation
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
## 2017-11-18 ENCOUNTER — Encounter (INDEPENDENT_AMBULATORY_CARE_PROVIDER_SITE_OTHER): Payer: TRICARE Prime—HMO | Admitting: Family Medicine

## 2017-12-03 ENCOUNTER — Encounter (INDEPENDENT_AMBULATORY_CARE_PROVIDER_SITE_OTHER): Payer: Self-pay | Admitting: Family Medicine

## 2017-12-03 ENCOUNTER — Ambulatory Visit (INDEPENDENT_AMBULATORY_CARE_PROVIDER_SITE_OTHER): Payer: TRICARE Prime—HMO | Admitting: Family Medicine

## 2017-12-03 VITALS — BP 107/69 | HR 67 | Temp 98.1°F | Ht <= 58 in | Wt 105.6 lb

## 2017-12-03 DIAGNOSIS — M5386 Other specified dorsopathies, lumbar region: Secondary | ICD-10-CM

## 2017-12-03 NOTE — Progress Notes (Deleted)
Reviewed

## 2017-12-03 NOTE — Progress Notes (Deleted)
See Radene Gunning note.

## 2017-12-03 NOTE — Patient Instructions (Addendum)
Pain Department scheduling phone number:  [phone]. We look forward to serving your Pain needs.        ===============================================================================================

## 2017-12-03 NOTE — Interdisciplinary (Signed)
Pre-visit chart review and huddle completed with staff and physician.    Outstanding labs, imaging and consults reviewed and identified.    Health maintanence issues identified and addressed:    Health Maintenance   Topic Date Due   . CERVICAL CANCER SCREENING  12/23/1991   . COLON CANCER SCREENING WITH COLONOSCOPY  12/23/2011   . INFLUENZA VACCINE  12/29/2017   . PHQ2 depression screen  04/29/2018   . BREAST CANCER SCREENING  07/11/2018   . IMM_TD/TDAP=>11 YO  06/26/2027   . Hepatitis C Screening  Completed   . HPV Vaccine <= 26 Yrs  Aged Out

## 2017-12-06 MED ORDER — ACETAMINOPHEN 500 MG OR TABS
1000.00 mg | ORAL_TABLET | Freq: Two times a day (BID) | ORAL | 0 refills | Status: AC | PRN
Start: 2017-12-06 — End: ?

## 2017-12-06 NOTE — Progress Notes (Signed)
SUBJECTIVE:    Gail Lewis is a 57 year old female with   Chief Complaint   Patient presents with   . Follow Up     Chronic right-sided low back pain with right-sided sciatica     Here today with husband.     She reports bilateral low back pain and bilateral leg pain, which is still present since last visit.  Still describes the pain as persistent, with occasional shooting pain down the leg.  She says that she cannot stand for longer than 10 minutes at a time, cannot bend down.  She denies any numbness.   Lumbar MRI 10/20/17 showed compression of several nerve roots (bilateral L5, R-sided S1) due to spinal canal narowing and disc bulge.    She uses salonpas and gabapentin for pain management.   Says that gabapentin helps a little bit, but makes her too drowsy so she only takes one dose of gabapentin at night.  She does not use any other pain medications.  Has tried PT previously, but says that PT made her pain worse.  She was seen by Dr. Earlene Plater at the pain clinic 10/26/17.  She was offered lumbar epidural steroid injection at the time, but patient declined.     She says that pain in R leg has been present for many years, but only began experiencing L leg pain within the last 6 months.  Denies any urinary symptoms, including oliguria, urinary/bowel incontinence.  Endorses some nocturia, but unchanged from baseline.    Current Outpatient Medications   Medication Sig   . acyclovir (ZOVIRAX) 800 MG tablet Take 800 mg by mouth 2 times daily.   Marland Kitchen ALPRAZolam (XANAX) 0.25 MG tablet Take 0.25 mg by mouth nightly as needed for Insomnia.   Marland Kitchen esomeprazole (NEXIUM) 40 MG capsule Take 40 mg by mouth every morning (before breakfast).   . gabapentin (NEURONTIN) 300 MG capsule Take 1 capsule (300 mg) by mouth 3 times daily.   . hydrocortisone 1 % cream Apply 1 Application topically 2 times daily as needed (neck rash). No longer than 2 weeks straight   . hydrOXYzine HCL (ATARAX) 25 MG tablet Take 25 mg by mouth 3 times daily as  needed for Itching.   . meclizine (ANTIVERT) 25 MG tablet Take 25 mg by mouth every 8 hours as needed for Dizziness.   . sertraline (ZOLOFT) 25 MG tablet Take 1 tablet (25 mg) by mouth daily.   . valACYclovir (VALTREX) 1 GM tablet Two tablets (2 grams) twice daily for 1 day within 48 hours of cold sore starting     No current facility-administered medications for this visit.        Review of Systems:  Constitutional: Negative for weight changes, fevers, chills, sweats, headache, appetite  Cardiovascular: Negative for chest pain, palpitations  Respiratory: Negative for shortness of breath  Gastrointestinal: Negative for abdominal pain, nausea, vomiting, diarrhea, constipation  Genitourinary: See HPI  Neurological: Negative for numbness in LE.    OBJECTIVE:  BP 107/69 (BP Location: Right arm, BP Patient Position: Sitting, BP cuff size: Regular)   Pulse 67   Temp 98.1 F (36.7 C) (Oral)   Ht 4\' 10"  (1.473 m)   Wt (!) 47.9 kg (105 lb 9.6 oz)   LMP  (LMP Unknown)   SpO2 96%   Breastfeeding? No   BMI 22.07 kg/m   General: Appears stated age, siting comfortably in chair, not in acute distress  HEENT: NCAT, EOMI.  Cardiovascular: Regular rate and  rhythm.  Positive S1, S2.  No murmurs.  Pulmonary: Clear to auscultation bilaterally.  No wheezes, crackles, or rhonchi.  Abdominal: Soft, NTND, NAB.  No hepatosplenomegaly.  Back: R>L paraspinal tenderness; no spinal ttp;   Extremities: 4/5 strength in LE flexors and extensors, limited by pain. Decreased sensation to light touch in R L5/S1 and in R L3 dermatomal distributions but sensation in these areas are still present. Positive straight leg test bilaterally.  2+ patellar and achilles reflexes bilaterally.  No LE edema.  Neurologic: CN II-XII intact.      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 There is narrowing of both lateral recesses at this level with probable mild contact/compression of the bilateral traversing L5 nerve roots, asymmetric to the right.    Additional regions of traversing nerve root contact suspected on the right at L5-S1 where there is apparent contact of the traversing right S1 nerve root by a right asymmetric disc bulge at this level, although no definite compression or posterior displacement is seen.    Multilevel facet hypertrophy and various degrees of foraminal disc bulging results in to moderate-severe neural foraminal narrowing on the left at L5-S1 where there is likely compression of the exiting left L5 nerve root. Additional regions of nerve root contact suspected involving the exiting left L4, exited right L4 and exited right L5 nerve roots as described above.    Assessment and Plan:  Gail Lewis is a 57 year old female with chronic bilateral back pain c/b bilateral sciatica.    #Bilateral low back pain with bilateral sciatica  Chronic.  Still c/o bilateral back and leg pain, stable since last visit.  Exam notable for positive straight leg tests in both legs, and mildly decreased bilateral LE strength with decreased sensation to light touch in several dermatomal distributions on the R.  Etiology of sciatica likely 2/2 nerve root compression at level of L5, S1 as seen on MRI lumbar spine.  No urinary symptoms at this point c/f immediate surgical intervention.  Has completed PT with mild improvement. Gabapentin helps but gets drowsy with gabapentin so only takes in evening. She will try taking during the daytime.  Was offered ESI by pain clinic on 10/26/17 office visit, but declined at that time; after long discussion today, she may consider and she will inform her pain management specialist if she does decide to pursue ESI  - Trial acetaminophen 1000 mg bid PRN for pain  - To try gabapentin  300 mg tid as tolerated  - Patient will re-consider epidural steroid injection with pain clinic  - f/u in 3 mo or sooner PRN    Patient Instruction:  See progress note.   Barriers to Learning assessed.  Patient and/or caregiver understanding of teaching and instructions verified.    Medication Review:  Medications reviewed with patient and/or caregiver and medication list reconciled.  Over the counter medications, herbal therapies and supplements reviewed.  Patient and/or caregiver understanding and response to medications assessed.    Barriers to medications assessed and addressed.   Risks, benefits, alternatives to medications reviewed.      Education and Case Management:  Goals of management reviewed with patient and/or caregiver.   Barriers to achieving goals reviewed and addressed with patient and/or  caregiver.       Updated medication list:  Current Outpatient Medications   Medication Sig   . acetaminophen (TYLENOL) 500 MG tablet Take 2 tablets (1,000 mg) by mouth 2 times daily as needed for Mild Pain (Pain Score 1-3) or Moderate Pain (Pain Score 4-6).   Marland Kitchen acyclovir (ZOVIRAX) 800 MG tablet Take 800 mg by mouth 2 times daily.   Marland Kitchen ALPRAZolam (XANAX) 0.25 MG tablet Take 0.25 mg by mouth nightly as needed for Insomnia.   Marland Kitchen esomeprazole (NEXIUM) 40 MG capsule Take 40 mg by mouth every morning (before breakfast).   . gabapentin (NEURONTIN) 300 MG capsule Take 1 capsule (300 mg) by mouth 3 times daily.   . hydrocortisone 1 % cream Apply 1 Application topically 2 times daily as needed (neck rash). No longer than 2 weeks straight   . hydrOXYzine HCL (ATARAX) 25 MG tablet Take 25 mg by mouth 3 times daily as needed for Itching.   . meclizine (ANTIVERT) 25 MG tablet Take 25 mg by mouth every 8 hours as needed for Dizziness.   . sertraline (ZOLOFT) 25 MG tablet Take 1 tablet (25 mg) by mouth daily.   . valACYclovir (VALTREX) 1 GM tablet Two tablets (2 grams) twice daily for 1 day within 48 hours of cold sore starting      No current facility-administered medications for this visit.        Note co-written with Barbee Cough, MS4

## 2017-12-07 ENCOUNTER — Encounter (INDEPENDENT_AMBULATORY_CARE_PROVIDER_SITE_OTHER): Payer: Self-pay | Admitting: Family Medicine

## 2017-12-07 DIAGNOSIS — M5416 Radiculopathy, lumbar region: Secondary | ICD-10-CM

## 2017-12-08 DIAGNOSIS — M5416 Radiculopathy, lumbar region: Secondary | ICD-10-CM | POA: Insufficient documentation

## 2017-12-08 NOTE — Telephone Encounter (Signed)
1) Completed questions below.    2) Please contact pt to confirm the:  A) "Dates for Disability to and from,"   B) "What day do you plan to start your disability," and   C) "When do you plan to return to work,"   so it matches what they submitted. Thank you!      +++++++++++++++++++++++++++++++    Receipt number   Dates for disability(to and from): 12/08/2017 - 12/09/2018    Is patient under your care for this medical problem?  YES    Are you presently treating for this illness/injury?  YES    Treatment Intervals (weekly, monthly etc)? Monthly    Was patient seen by another MD or facility?  YES    First date of TX: 04/29/2017    Diagnosis: Multilevel Lumbar Nerve Root Compression    ICD code: M54.16    Permanent or Temporary?  Permanent    Any secondary diagnosis and code?  Sciatica associated with disorder of Lumbar Spine.  M53.86.    Date hospitalized & Discharged?  N/A    Pt able to work prior to injury/ilness?  Yes    Condition aggravated by pt's regular work?   Yes    Is this for recommendation of drug or alchohol tx?  No    Date pt became a resident of a treatment facility?  n/a    Would disclosure of this information be medically or psychologically detrimental to pt?  No    Pregnancy related?  no    Date of Delivery: N/A    EDD Receipt #:    Vaginal or C-section?  N/A    Complicated/uncomplicated?  N/A    What day do you plan to start your disability?  12/08/2017    When do you plan to return to work?  12/09/2018

## 2017-12-08 NOTE — Telephone Encounter (Signed)
Receipt number   Dates for disability(to and from):     Is patient under your care for this medical problem?      Are you presently treating for this illness/injury?      Treatment Intervals (weekly, monthly etc)?      Was patient seen by another MD or facility?      First date of TX:     Diagnosis:     ICD code:     Permanent or Temporary?      Any secondary diagnosis and code?      Date hospitalized & Discharged?      Pt able to work prior to injury/ilness?      Condition aggravated by pt's regular work?       Is this for recommendation of drug or alchohol tx?      Date pt became a resident of a treatment facility?      Would disclosure of this information be medically or psychologically detrimental to pt?     Pregnancy related?      Date of Delivery:    EDD Receipt #:    Vaginal or C-section?     Complicated/uncomplicated?      What day do you plan to start your disability?      When do you plan to return to work?

## 2017-12-08 NOTE — Telephone Encounter (Signed)
EDD completed patient notified via mychart 11/29/17-11/30/18    [id number]

## 2017-12-08 NOTE — Telephone Encounter (Signed)
From: Sissy Hoff Vicario  To: Delbert Phenix, MD  Sent: 12/07/2017 10:35 PM PDT  Subject: 20-Other    EDD disability application receipt number: [id number]

## 2017-12-17 ENCOUNTER — Encounter (INDEPENDENT_AMBULATORY_CARE_PROVIDER_SITE_OTHER): Payer: Self-pay | Admitting: Family Medicine

## 2017-12-17 ENCOUNTER — Telehealth (INDEPENDENT_AMBULATORY_CARE_PROVIDER_SITE_OTHER): Payer: Self-pay | Admitting: Audiology

## 2017-12-21 ENCOUNTER — Telehealth (INDEPENDENT_AMBULATORY_CARE_PROVIDER_SITE_OTHER): Payer: Self-pay | Admitting: Family Medicine

## 2017-12-21 NOTE — Telephone Encounter (Signed)
Husband of pt came in to drop off EDD form to be filled out by PCP.    Mr. Gail Lewis is asking if it can be mailed out he placed an envelope w/stamp inside.     Please contact phone number on file when completed, either sent out or filled out to be picked up.    Placed @ front bin.

## 2017-12-21 NOTE — Telephone Encounter (Signed)
Form placed in Dr. Marigene EhlersLee's inbox to review and complete.

## 2017-12-23 NOTE — Telephone Encounter (Signed)
Please contact pt and gather the following information from pt to adequately complete the disability paperwork.  Please send encounter back to me after these two questions are answered.  Thank you!    1) Dates for disability(to and from):   2) What day do you plan to start your disability? (Date needed)  3) When do you plan to return to work?  (Date needed)    Paperwork still in my inbox.    Thank you!    ++++++++++++++++++++++++++++++++++++++++++++++++    Receipt number   Dates for disability(to and from): NEEDED     Is patient under your care for this medical problem?  Yes    Are you presently treating for this illness/injury?  Yes    Treatment Intervals (weekly, monthly etc)? Monthly    Was patient seen by another MD or facility?  Yes    First date of TX: 04/29/2017    Diagnosis: Sciatica associated with disorder of lumbar spine    ICD code: M53.86    Permanent or Temporary?  Temporary    Any secondary diagnosis and code?  Chronic right-sided lower back pain with right-sided sciatica.  M54.41, G89.29    Date hospitalized & Discharged?  N/A    Pt able to work prior to injury/ilness?  Yes    Condition aggravated by pt's regular work?   Yes    Is this for recommendation of drug or alchohol tx?  No    Date pt became a resident of a treatment facility?  No    Would disclosure of this information be medically or psychologically detrimental to pt?  No    Pregnancy related?  No    Date of Delivery: N/A    EDD Receipt #:    Vaginal or C-section?  N/A    Complicated/uncomplicated?  N/A    What day do you plan to start your disability?  NEEDED    When do you plan to return to work?  DATE NEEDED

## 2017-12-25 ENCOUNTER — Encounter: Payer: Self-pay | Admitting: Hospital

## 2017-12-25 NOTE — Telephone Encounter (Signed)
First attempt:    I called patient and left message to call back. No personal information was left in message. Number left for patient to call back was [phone]     Sent patient a mychart message.

## 2017-12-29 NOTE — Telephone Encounter (Signed)
Who is calling: Incoming call from spouse [employer] Coverage Verified: Active- in network    Reason for this call: Patient spouse Reuel Boom called.  States returning call from Dr Nedra Hai nurse.    Nurse not able .  To please call back.      Action required by office: Please contact caller    Duplicate encounter? No previous documentation found on this issue.     Best way to contact: [phone]  Alternative: no    Inquiry has been read verbatim to this caller. Verbalizes satisfaction and confirms the above is accurate: yes      Has been advised this message will be transmitted to office and can expect a response within the next 24-72 hours.

## 2017-12-30 ENCOUNTER — Telehealth (INDEPENDENT_AMBULATORY_CARE_PROVIDER_SITE_OTHER): Payer: Self-pay | Admitting: Family Medicine

## 2017-12-30 NOTE — Telephone Encounter (Signed)
I tried calling the patient and was unable to get through and couldn't leave a voicemail. Will try again later.

## 2017-12-30 NOTE — Telephone Encounter (Signed)
Pts husband walked in to drop off a letter for Dr. Nedra Hai to read, he is asking for a response via my chart.      Placed @ front bin

## 2017-12-31 NOTE — Telephone Encounter (Signed)
Letter was dropped of from patients husband stating the following below    Dr. Nedra Hai   My wife became disabled around July 08 2016 to present due to ongoing issues with her lower back. She requests a present (re)-start date of Sept. 15th, 2019.   She has no specific return to work date as this is an ongoing issue . She is requesting the maximum time that you are willing to let her have. Thank you for your attention to this matter.     Letter was placed in your inbox for reference if needed.

## 2017-12-31 NOTE — Telephone Encounter (Signed)
Information from paper was documented on tel encounter dated 12/21/2017. Closing encounter

## 2018-01-01 NOTE — Telephone Encounter (Signed)
Please have one of our LVN staff (or other authorized staff) file the Disability Claim electronically & then update pt.   Thank you!    ++++++++++++++++++++++++++++++++++++++++++++++    Receipt number: CLAIM ID = [id number]    Dates for disability(to and from): 07/08/2016 to 03/31/2019    Is patient under your care for this medical problem?  Yes    Are you presently treating for this illness/injury?  Yes    Treatment Intervals (weekly, monthly etc)? Monthly    Was patient seen by another MD or facility?  Yes    First date of TX: 04/29/2017    Diagnosis: Sciatica associated with disorder of lumbar spine    ICD code: M53.86    Permanent or Temporary?  Temporary    Any secondary diagnosis and code?  Chronic right-sided lower back pain with right-sided sciatica.  M54.41, G89.29    Date hospitalized & Discharged?  N/A    Pt able to work prior to injury/ilness?  Yes    Condition aggravated by pt's regular work?   Yes    Is this for recommendation of drug or alchohol tx?  No    Date pt became a resident of a treatment facility?  No    Would disclosure of this information be medically or psychologically detrimental to pt?  No    Pregnancy related?  No    Date of Delivery: N/A    EDD Receipt #:    Vaginal or C-section?  N/A    Complicated/uncomplicated?  N/A    What day do you plan to start your disability?  12/13/2017    When do you plan to return to work?  04/01/2019

## 2018-01-03 ENCOUNTER — Encounter: Payer: Self-pay | Admitting: Hospital

## 2018-01-04 NOTE — Telephone Encounter (Signed)
EDD completed receipt number [id number]

## 2018-02-03 ENCOUNTER — Telehealth (INDEPENDENT_AMBULATORY_CARE_PROVIDER_SITE_OTHER): Payer: Self-pay | Admitting: Family Medicine

## 2018-02-03 DIAGNOSIS — F320 Major depressive disorder, single episode, mild: Secondary | ICD-10-CM

## 2018-02-03 DIAGNOSIS — F411 Generalized anxiety disorder: Secondary | ICD-10-CM

## 2018-02-03 NOTE — Telephone Encounter (Signed)
Patients husband requesting Rx for   sertraline (ZOLOFT) 25 MG tablet [[id number]]    Order Details   Dose: 25 mg Route: Oral Frequency: DAILY   Dispense Quantity: 90 tablet Refills: 3 Fills remaining: --         To be increased to 50 mg. Which was originally prescribed.    Please call husband when Rx is ready at   [facility]- [phone]

## 2018-02-04 MED ORDER — SERTRALINE HCL 50 MG OR TABS
50.00 mg | ORAL_TABLET | Freq: Every day | ORAL | 2 refills | Status: AC
Start: 2018-02-04 — End: ?

## 2018-02-04 NOTE — Telephone Encounter (Signed)
I called patient and advised her the Rx will be at the front for pick up and reminded her to schedule a follow-up. She will call back to schedule.

## 2018-02-04 NOTE — Telephone Encounter (Signed)
Increased dosage to Sertraline 50mg  QD  Rx printed and in my outbox.     Please have them make an appt with me in 1-2 months (40-min merge please).    Thank you!

## 2018-02-04 NOTE — Telephone Encounter (Signed)
Patients husband is requesting a change in dosage, please advise

## 2018-11-11 IMAGING — CR L-SPINE 2-3 VWS
1 series · 3 of 3 positions shown · non-contrast
Comparison: None

PROCEDURE: Lumbar spine 3 views November 11, 2018

INDICATION: Low back pain

[Series 1: ap · 0.17mm/px · 3 of 3 slices shown]
[im 1/3]
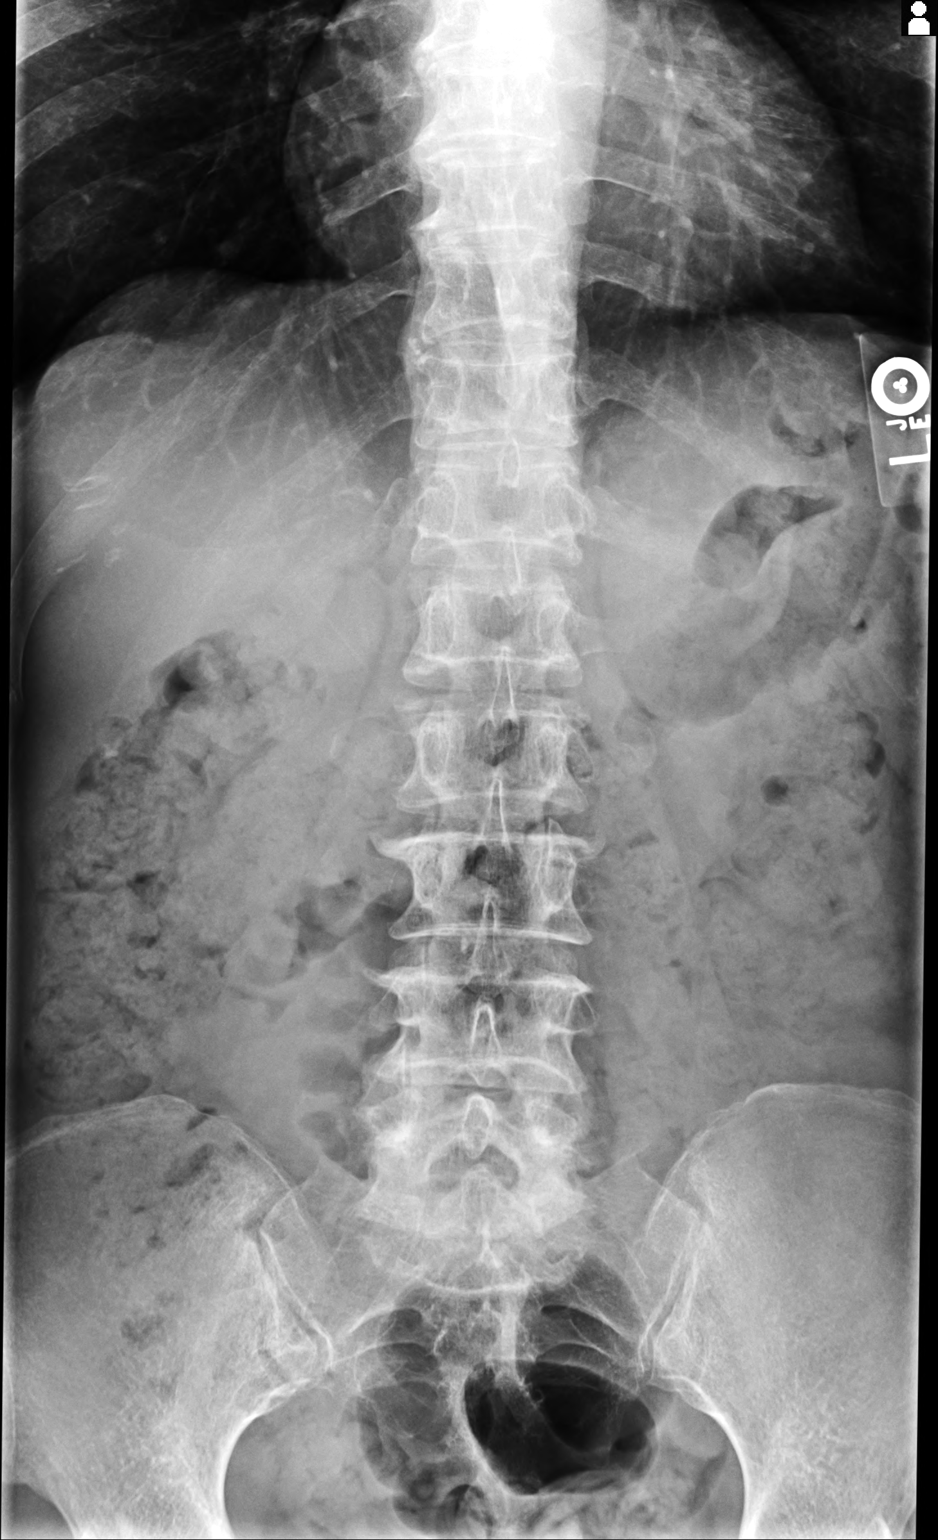
[im 2/3]
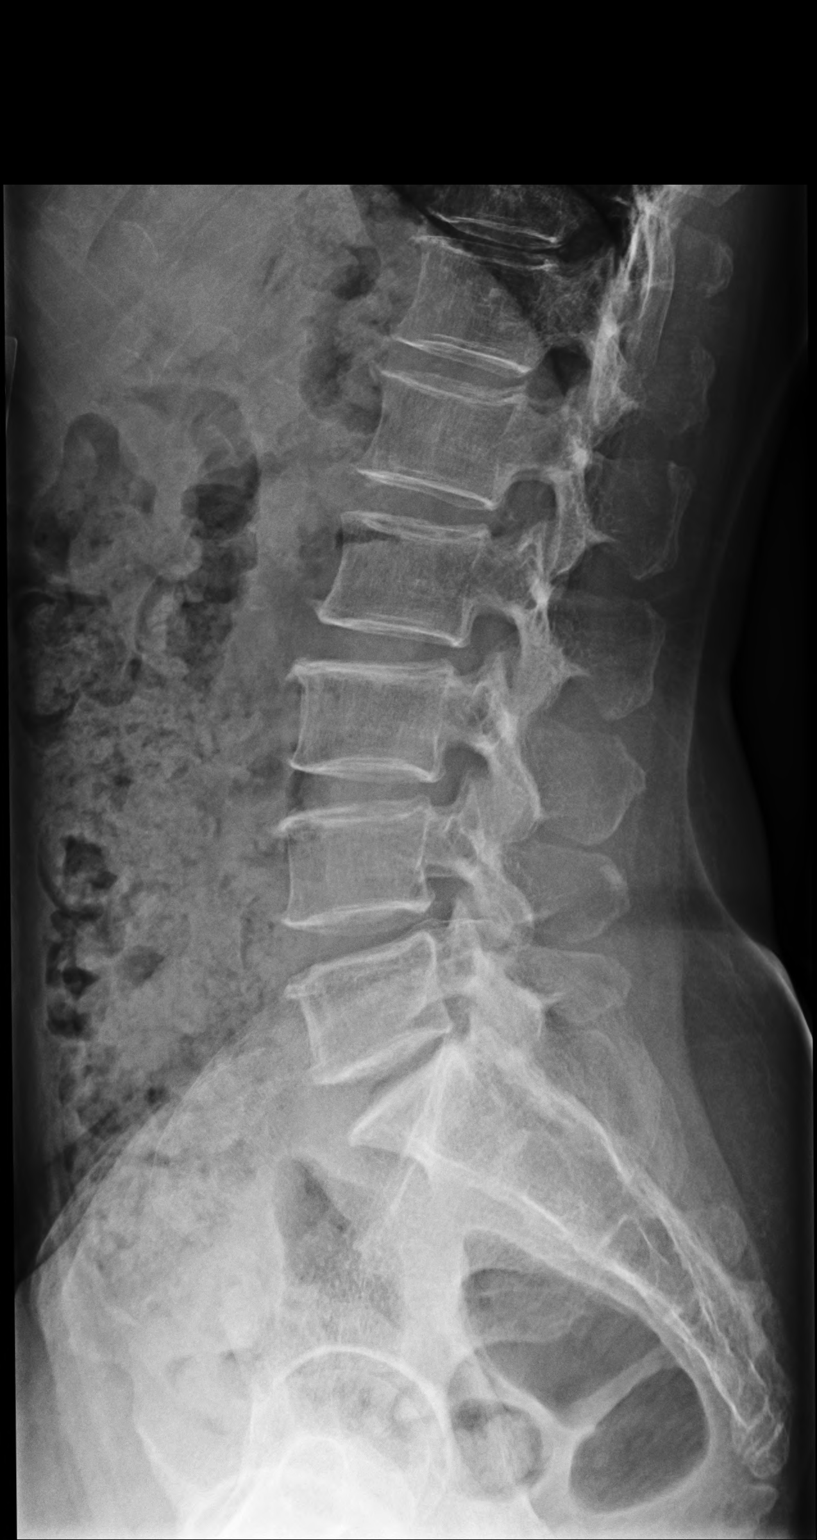
[im 3/3]
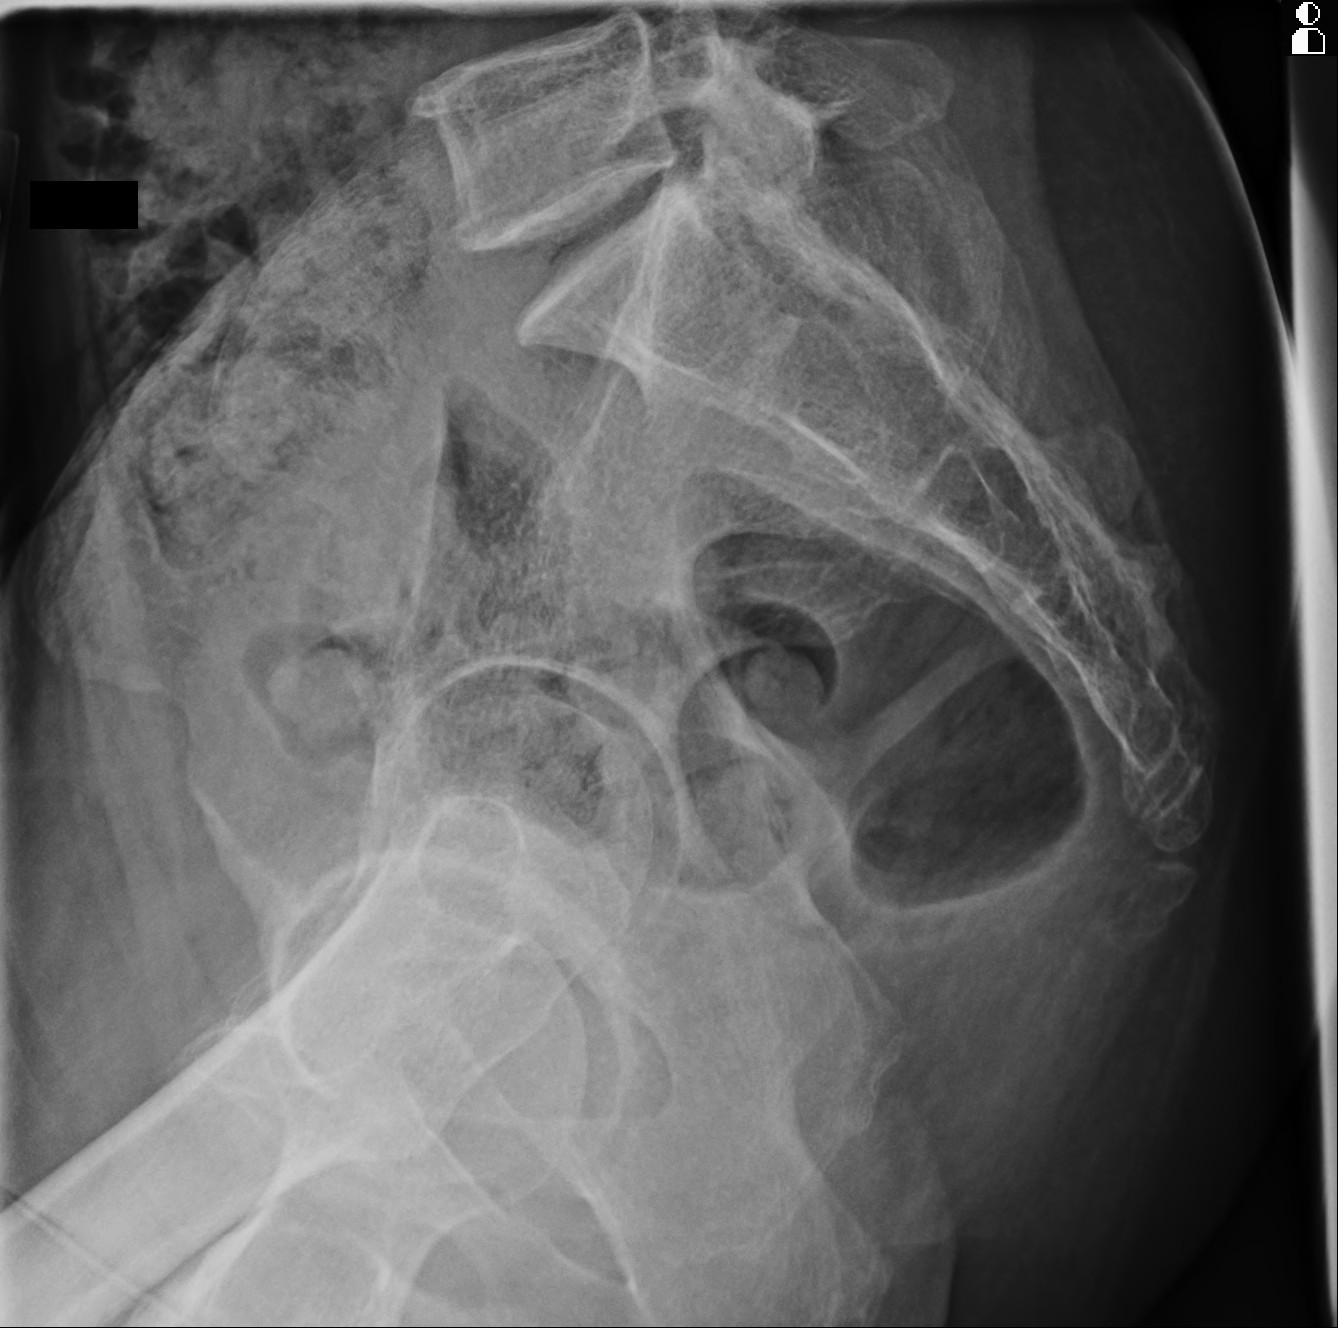

[3 of 3 positions shown; findings below may reference images not displayed]

FINDINGS: There are 5 lumbar-type vertebrae. Generalized decreased bone density with mild compression fracture deformity of the superior endplate of L3 and L4 vertebral bodies of indeterminate age. Mild decrease in disc space is seen in the visualized lower thoracic spine and at L5-S1 in keeping with degenerative disc disease. Small anterior and lateral osteophytes are seen throughout the lower thoracic and lumbar spine in keeping with spondylosis. Mild bilateral degenerative changes of the facets are seen at L5-S1.
IMPRESSION: 1. Generalized decreased bone density with mild compression fracture deformity of the superior endplate of L3 and L4 vertebral bodies of indeterminate age.

2. Spondylosis of the lower thoracic and lumbar spine with mild degenerative disc disease of the lower thoracic and lower lumbar spine.

3. Bilateral facet arthropathy seen at L5-S1.

## 2018-11-18 IMAGING — CR KNEE RT 3 VWS
1 series · 3 of 3 positions shown · non-contrast
Comparison: None available.

INDICATION: Pain to right knee
TECHNIQUE: 3 views of the right knee are obtained.

[Series 1: sun rise · 0.17mm/px · 3 of 3 slices shown]
[im 1/3]
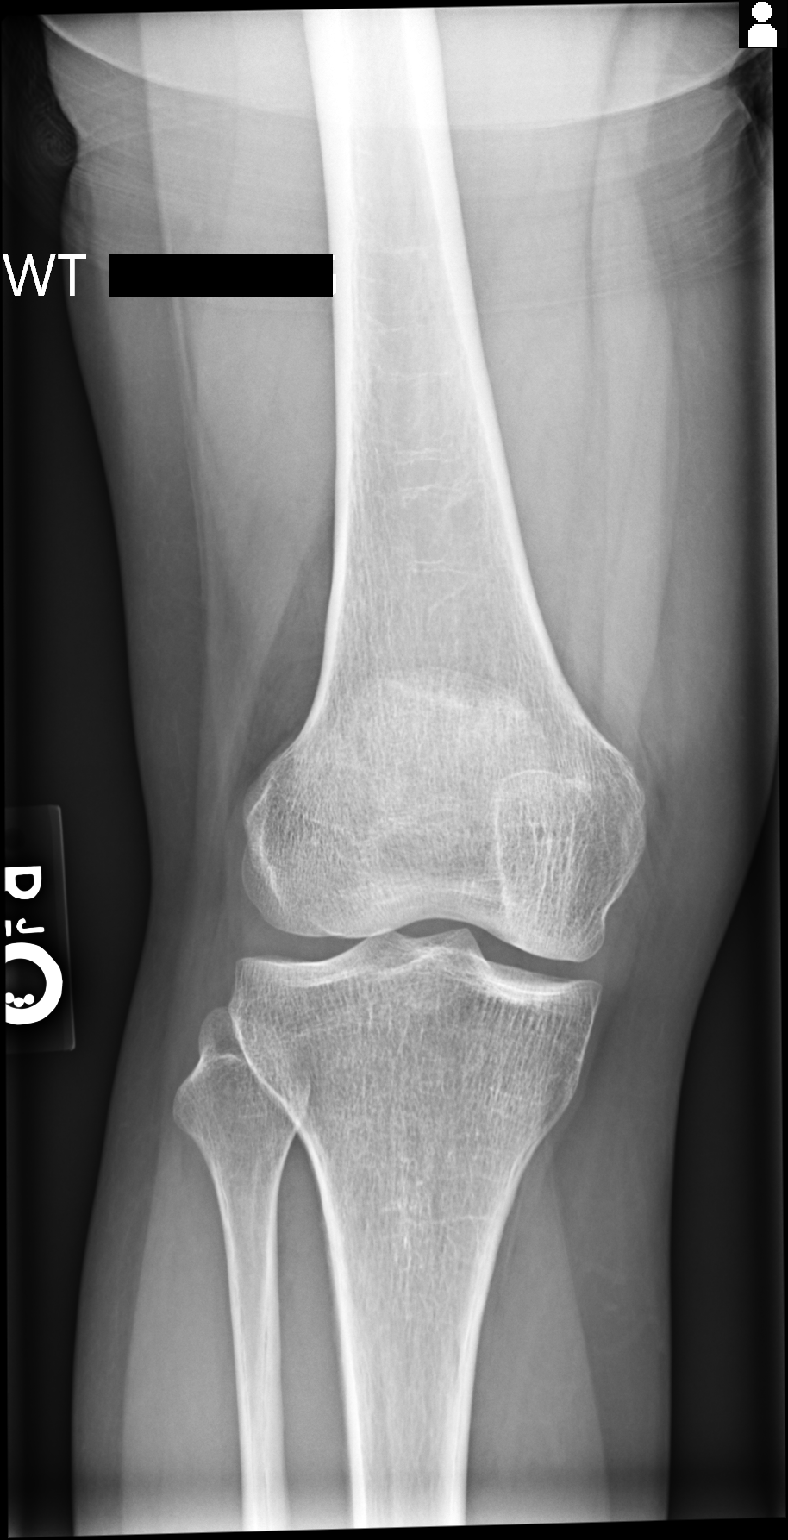
[im 2/3]
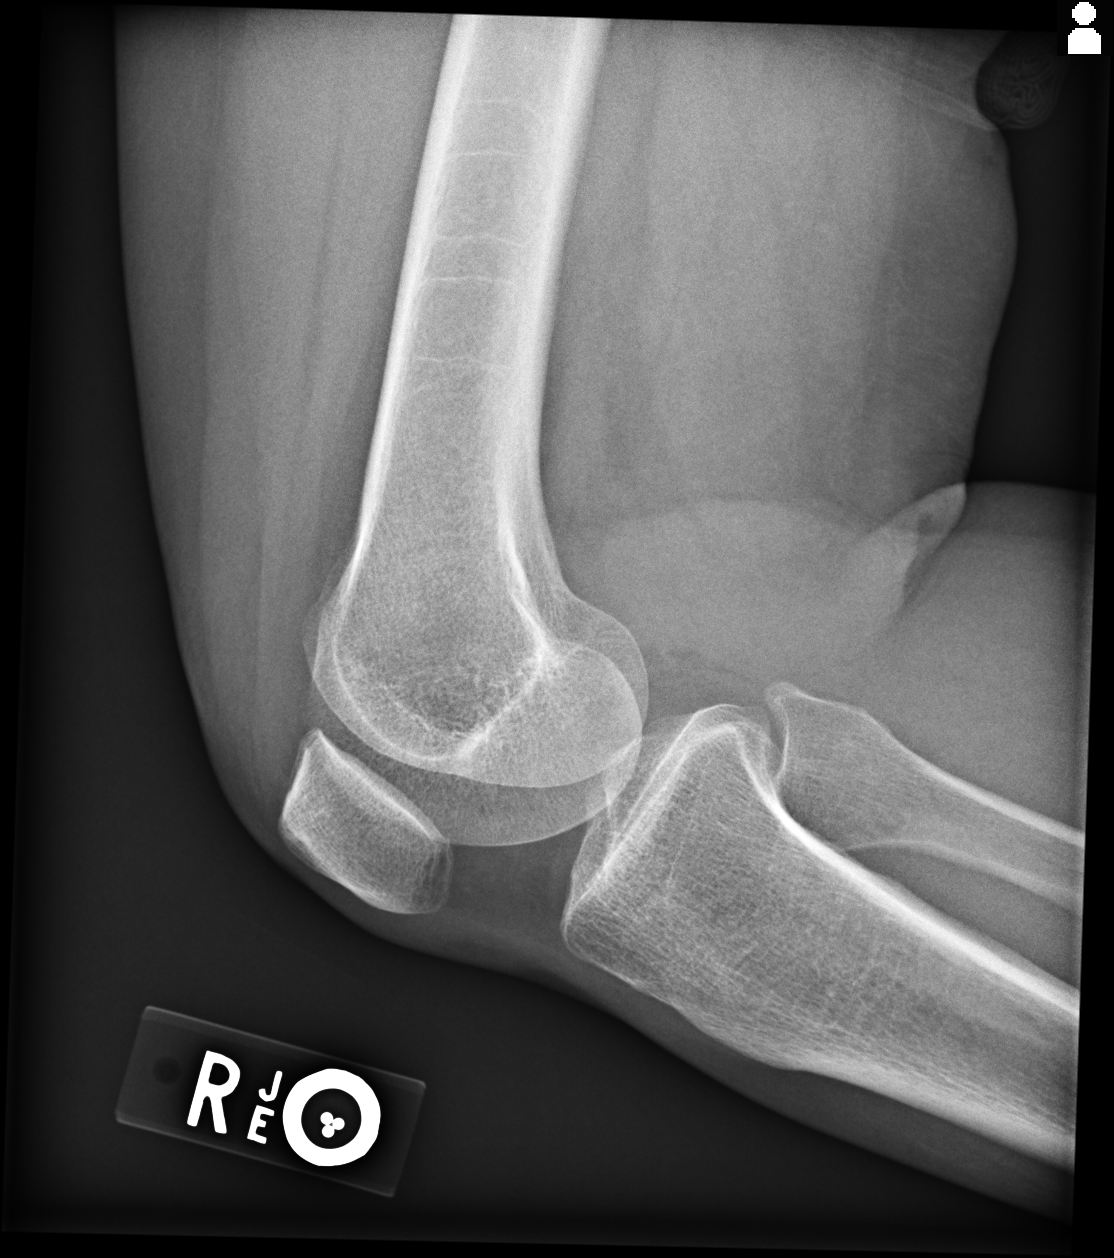
[im 3/3]
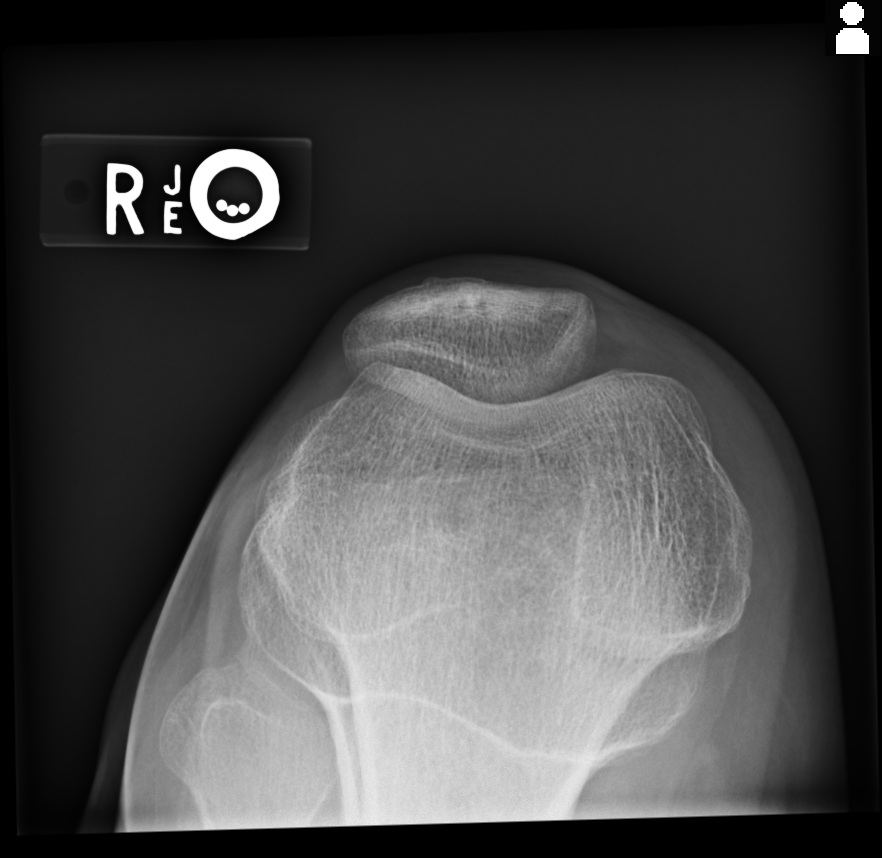

[3 of 3 positions shown; findings below may reference images not displayed]

FINDINGS: No acute fracture or dislocation. Normal mineralization. No erosions. Mild patellofemoral joint space narrowing. No joint effusion.
IMPRESSION: 1.
No acute fracture of the right knee.

2.
Mild patellofemoral joint space narrowing.

## 2018-11-22 IMAGING — OT DXA BONE DENSITY
2 series · 2 of 2 positions shown · non-contrast
Comparison: none

REASON FOR EXAM: Postmenopausal screening.

RISK FACTORS:  Low body weight (less than 127 pounds).
PRIOR EXAMS:  None.
METHOD:  Scans of the spine and hip were performed using dual energy X-ray densitometry (DXA) with the H[employer] Discovery C (S/EYVUKC) system.

[Series 1: — · 1 of 1 slices shown (1 of 2)]
[im 1/1]
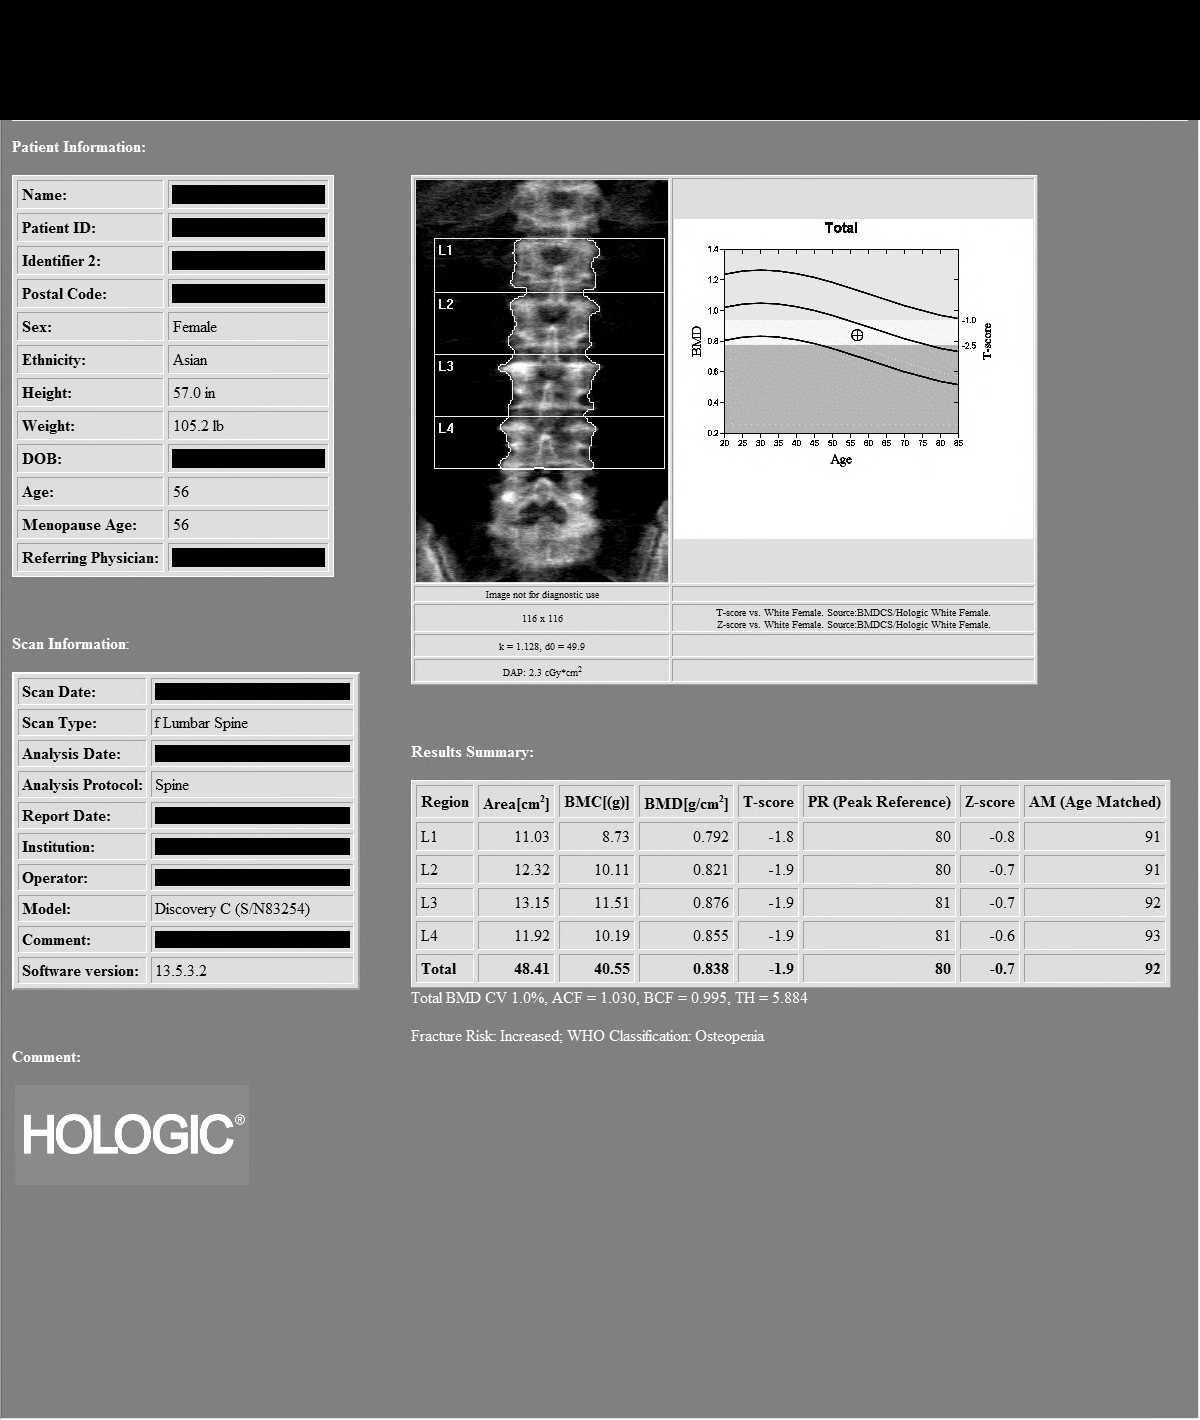

[Series 2: — · left · 1 of 1 slices shown (2 of 2)]
[im 1/1]
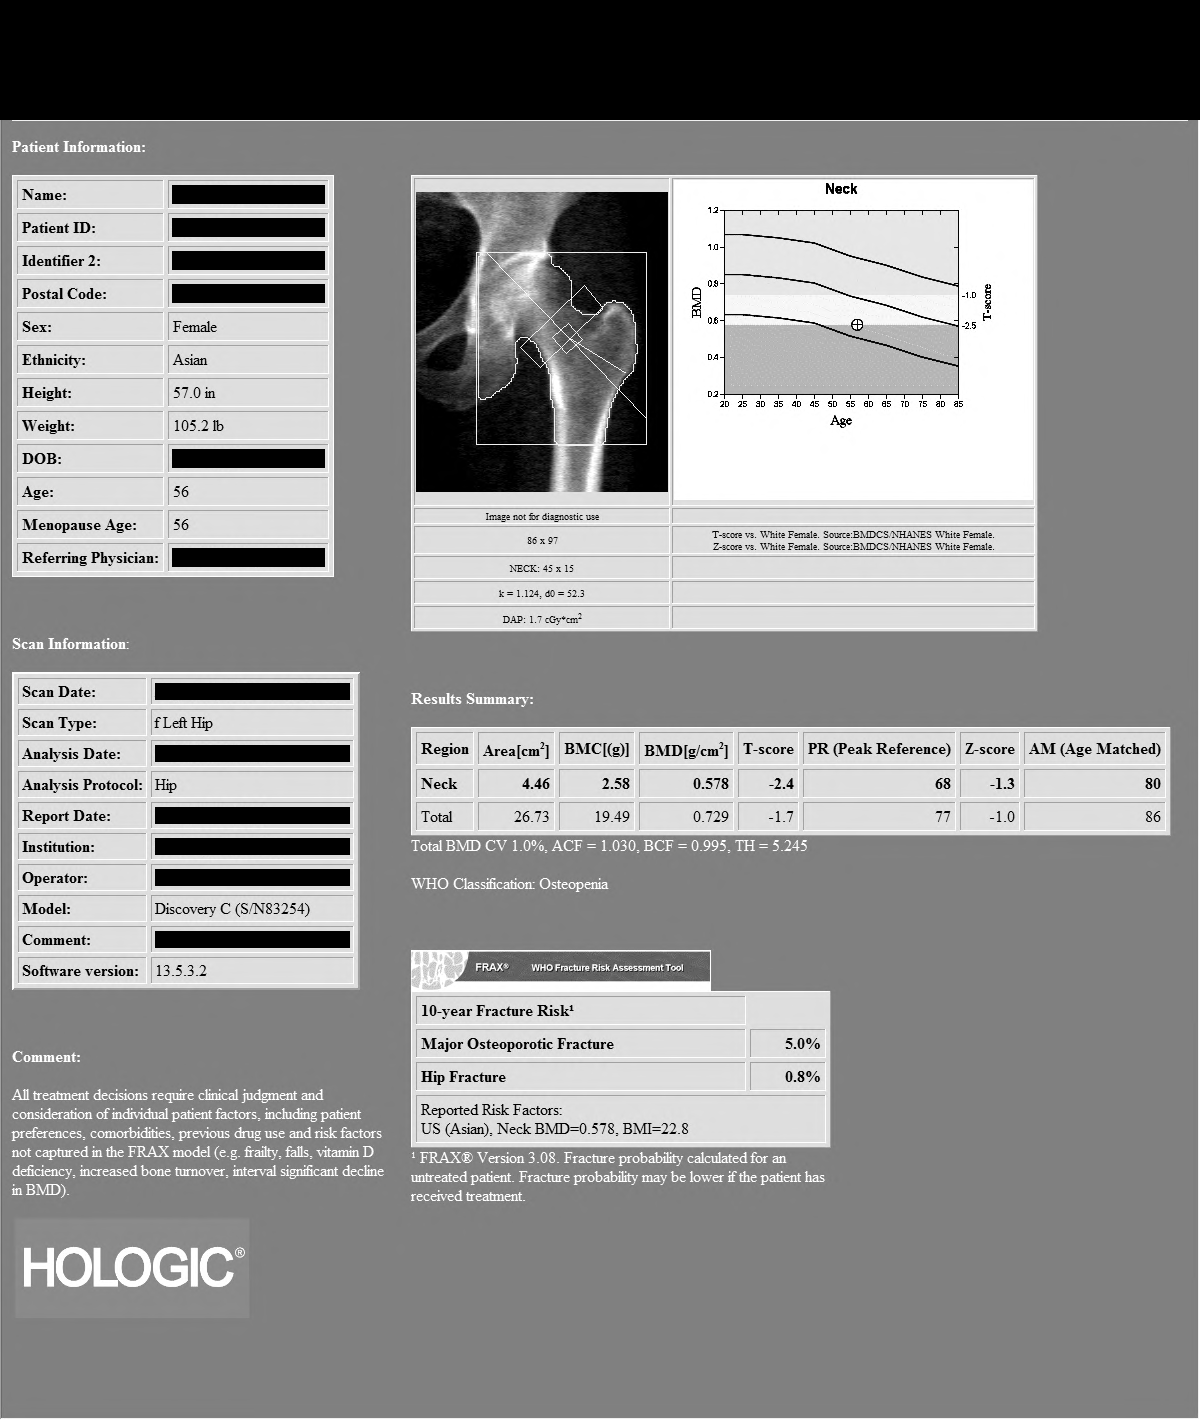

[2 of 2 positions shown; findings below may reference images not displayed]

IMPRESSION: As defined by World Health Organization, the patient meets the criteria for OSTEOPENIA based on lumbar spine and hip T-scores.

PATIENT DEMOGRAPHICS: 57-year-old Asian female.
FINDINGS: 1.    Review of scanogram images shows no factor invalidating scan results.  

2.    The lumbar spine exam using L1-L4 regions shows average Bone Mineral Density is 0.838 gm/cm2 of Hydroxyapatite.  The T-score (comparing patient with a young adult group) is 1.9 standard deviations BELOW mean. The Z-score (comparing patient with an age-matched group) is 0.7 standard deviations BELOW mean.

3.  The left hip exam using femoral neck region of interest shows average Bone Mineral Density is 0.578 gm/cm2 of Hydroxyapatite. The T-score (comparing patient with a young adult group) is 2.4 standard deviations BELOW mean. The Z-score (comparing patient with an age-matched group) is 1.3 standard deviations BELOW mean.

According to the World Health Organization risk assessment tool (FRAX) for osteopenia only, which uses the femoral neck T-score and includes other patient risk factors for fracture, the patient has a 10-year absolute risk of hip fracture of 0.8% and 10-year absolute risk fracture for any major fracture of 5.0%.

RECOMMENDATIONS: The patient states that she is not taking supplements on a regular basis and regular exercise to patient tolerance would be of benefit.  The patient is currently not taking prescribed medication for prevention of bone loss.  According to criteria established by the National Osteoporosis Foundation, the patient DOES NOT meet the current indications for prescribed medical therapy.  The National Osteoporosis Foundation now recommends followup DXA scanning every two years in patients at risk regardless of whether the patient is undergoing pharmacological treatment.

World Health Organization criteria for diagnosis, please see link below.

[URL]

## 2018-11-25 IMAGING — MR MRI LSPINE WO CONTRAST
4 of 5 series · 25 of 48 positions shown · IV contrast (gadolinium)
Comparison: Lumbar spine, 11/11/18 demonstrating 5 lumbarized segments.

HISTORY/INDICATION:  Radiculopathy, low back pain radiating to left leg
TECHNIQUE: Scans of the lumbar spine are performed in the sagittal and transaxial planes using T1, fast spin-echo T2 and STIR scan sequences without gadolinium.

[Series 2: t2_sag · sagittal · 4.0mm · 0.68mm/px · 6 of 15 slices shown]
[im 1/15]
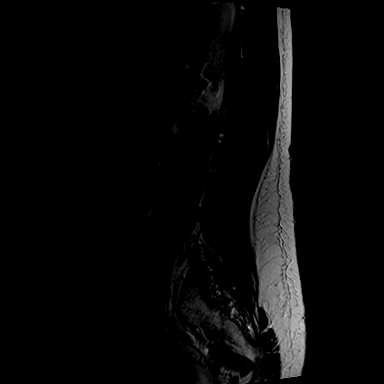
[im 3/15]
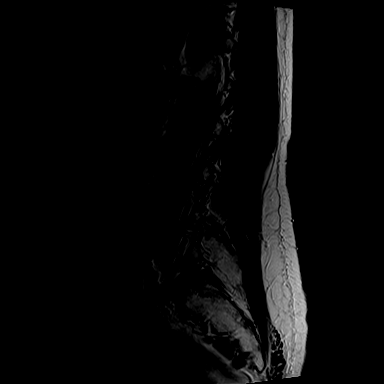
[im 6/15]
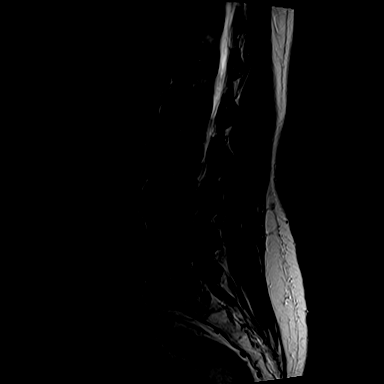
[im 9/15]
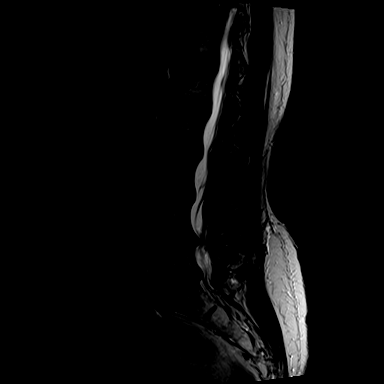
[im 12/15]
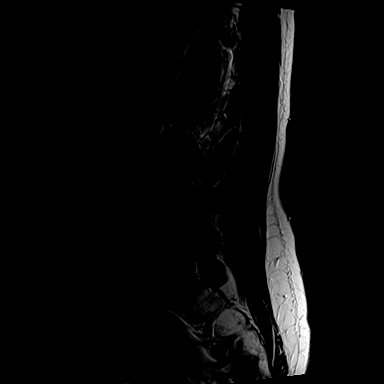
[im 15/15]
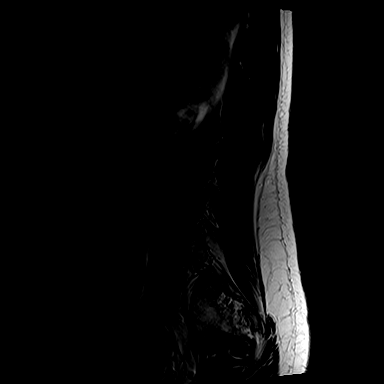

[Series 3: t1_sag · sagittal · 4.0mm · 0.81mm/px · 7 of 15 slices shown]
[im 1/15]
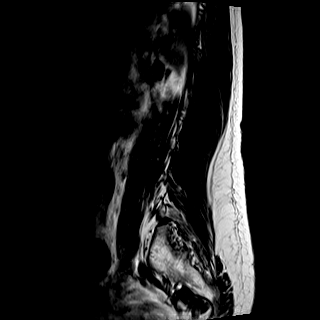
[im 3/15]
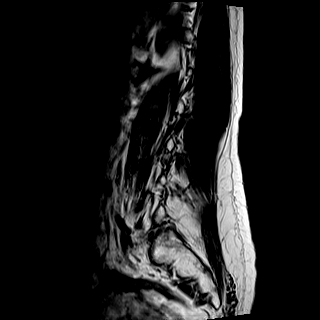
[im 5/15]
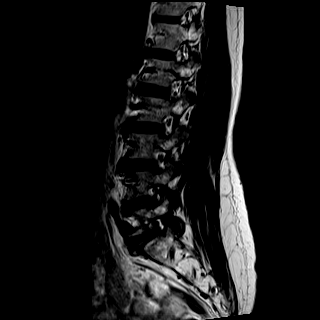
[im 8/15]
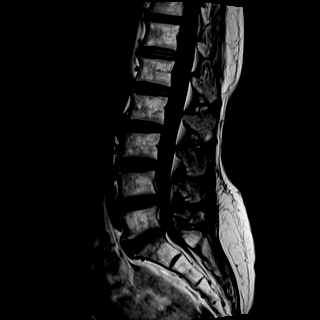
[im 10/15]
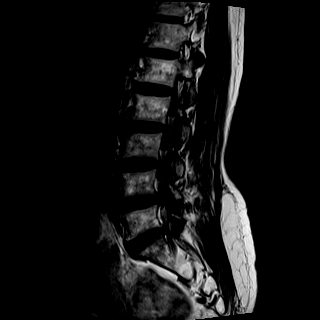
[im 12/15]
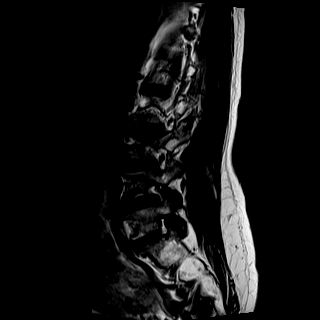
[im 15/15]
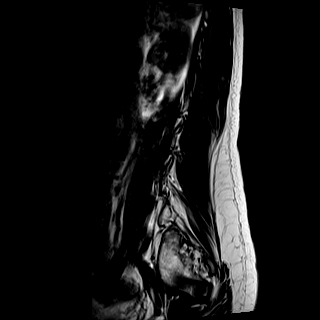

[Series 4: ir_sag · sagittal · 4.0mm · 0.51mm/px · 7 of 15 slices shown]
[im 1/15]
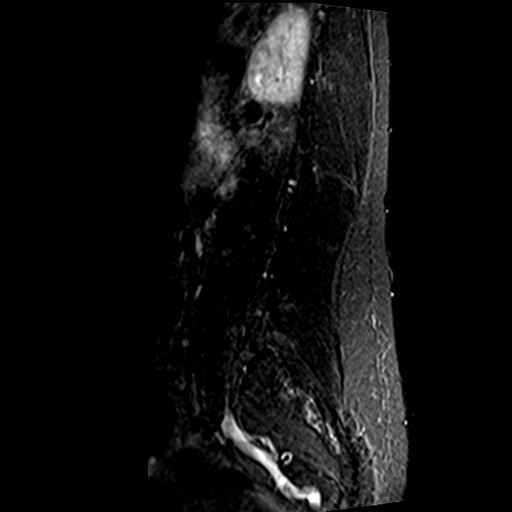
[im 3/15]
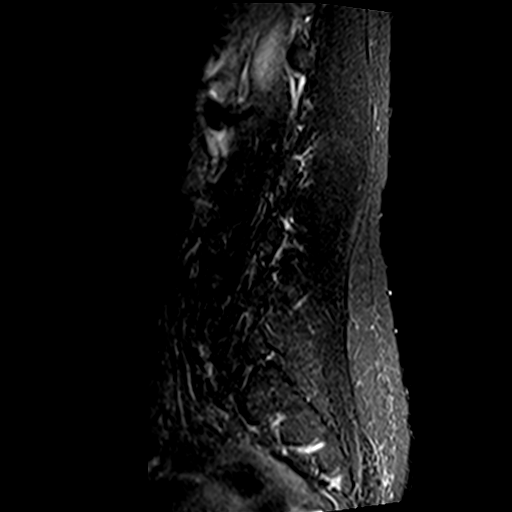
[im 5/15]
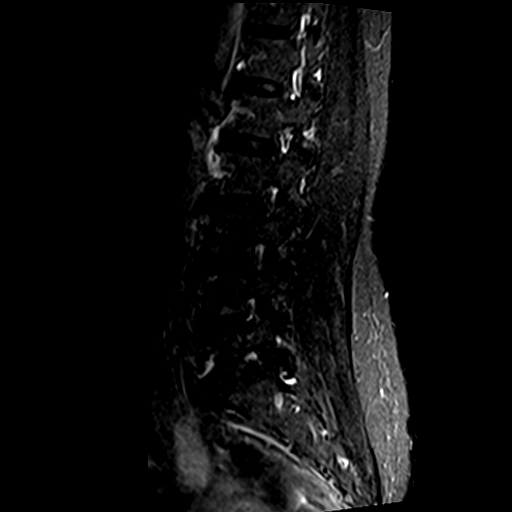
[im 8/15]
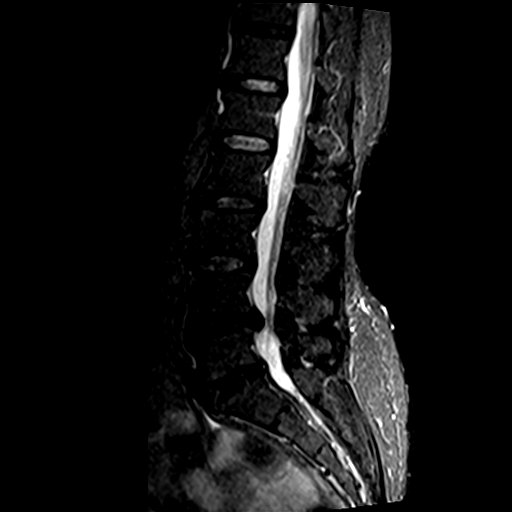
[im 10/15]
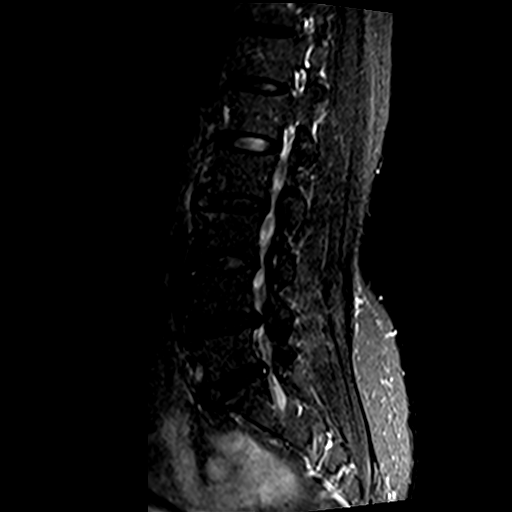
[im 12/15]
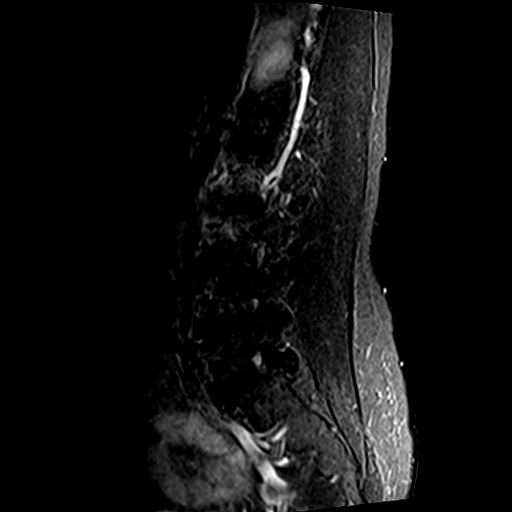
[im 15/15]
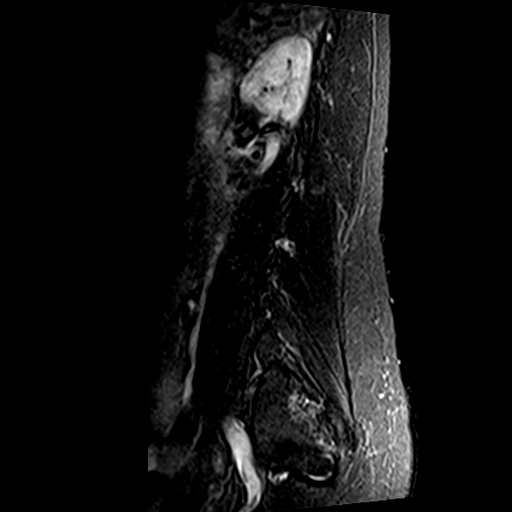

[Series 8: t1_axial_obl · axial · 4.0mm · 0.39mm/px · z∈[-131,+50]mm · 5 of 25 slices shown]
[im 1/25]
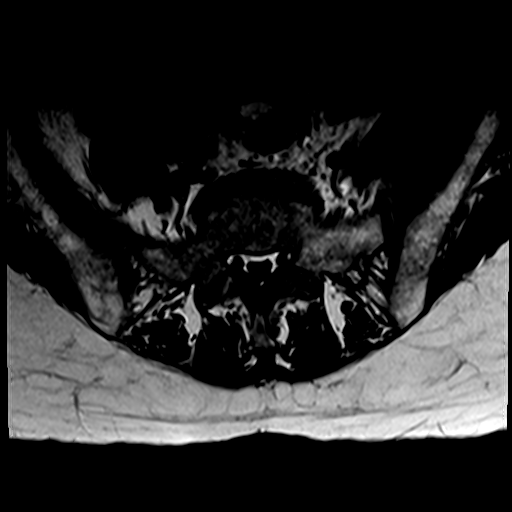
[im 5/25]
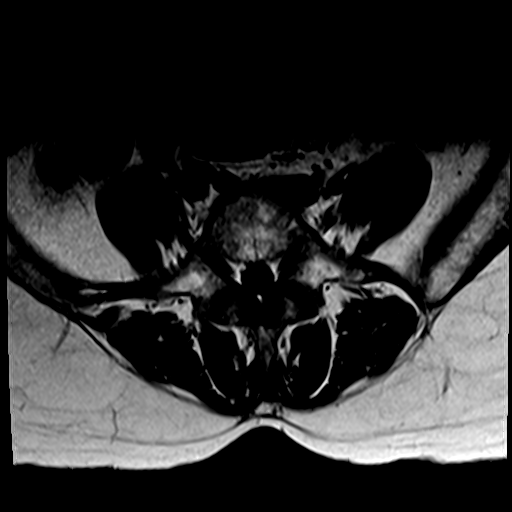
[im 7/25]
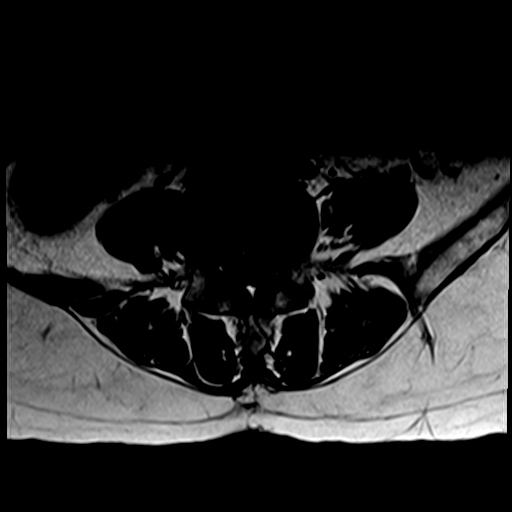
[im 14/25]
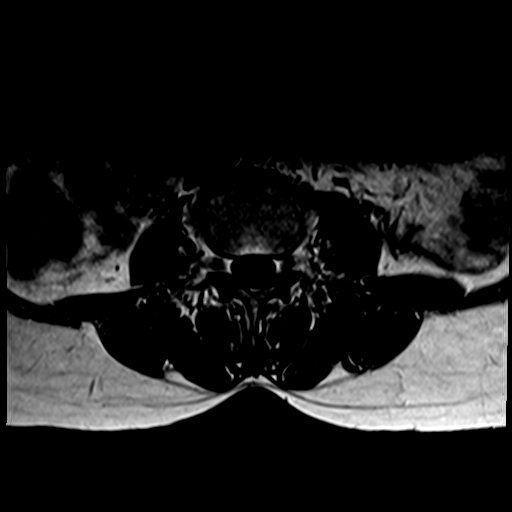
[im 22/25]
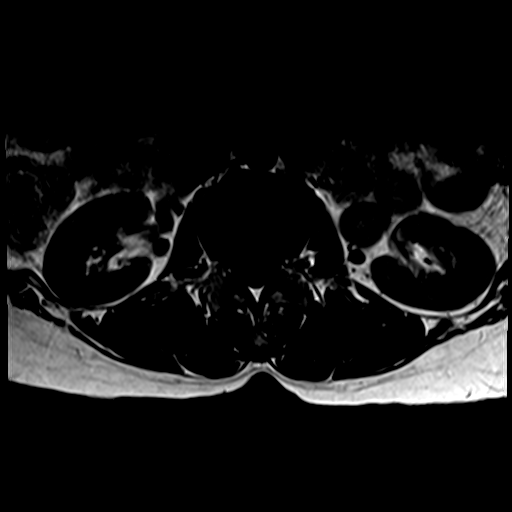

[25 of 48 positions shown; findings below may reference images not displayed]

FINDINGS: Normal marrow space signal intensity is seen with no marrow space replacing process or fracture. Disc dehydration is present at L4-5 and L5-S1 without disc narrowing.

2 mm bulge is seen at T11-12 with small annular tear not compromising central canal. Disc spaces at T12-L1 and L1-2 are normal. Central canal and foramina are normal at all 3 levels. Caudal margin of spinal cord is normal terminating at the T12-L1 disc.

At L2-3, 2 mm diameter circumferential disc bulge is seen with intact central canal, neural foramina and facets.

At L3-4, 2 mm diameter circumferential disc bulge is present with intact central canal, neural foramina and facets.

At L4-5, large central and right and left paracentral disc extrusion which measures 7.5 mm in the midline is seen with mild facet and flavum hypertrophy contributing to severe central canal and bilateral lateral recess stenosis compressing origins of right and left L5 nerve. Mild left foraminal stenosis effacing the exiting left L4 nerve root is seen.

At L5-S1, 5 mm diameter circumferential disc extrusion is seen with right paracentral annular tear. The finding mildly effaces the thecal sac and causes mild bilateral lateral recess stenosis. Isolated moderate left foraminal stenosis effacing the left L5 nerve root is seen.
IMPRESSION: 1. T11-12 disc bulge with annular tear and mild disc bulges at L2-3 and L3-4 without neural compromise.

2. Large L4-5 disc extrusion with facet and flavum hypertrophy causing severe central canal and bilateral lateral recess stenosis and mild left foraminal stenosis.

3. L5-S1 circumferential disc extrusion with annular tear causing mild central canal and mild bilateral lateral recess stenosis with moderate left foraminal stenosis.

## 2019-02-03 IMAGING — CR L-SPINE FLEX 2-3 VWS
1 series · 4 of 4 positions shown · non-contrast
Comparison: 11/11/2018

Lumbar spine radiographs, 4 views
INDICATION: Low back pain

[Series 1: ap · 0.17mm/px · 4 of 4 slices shown]
[im 1/4]
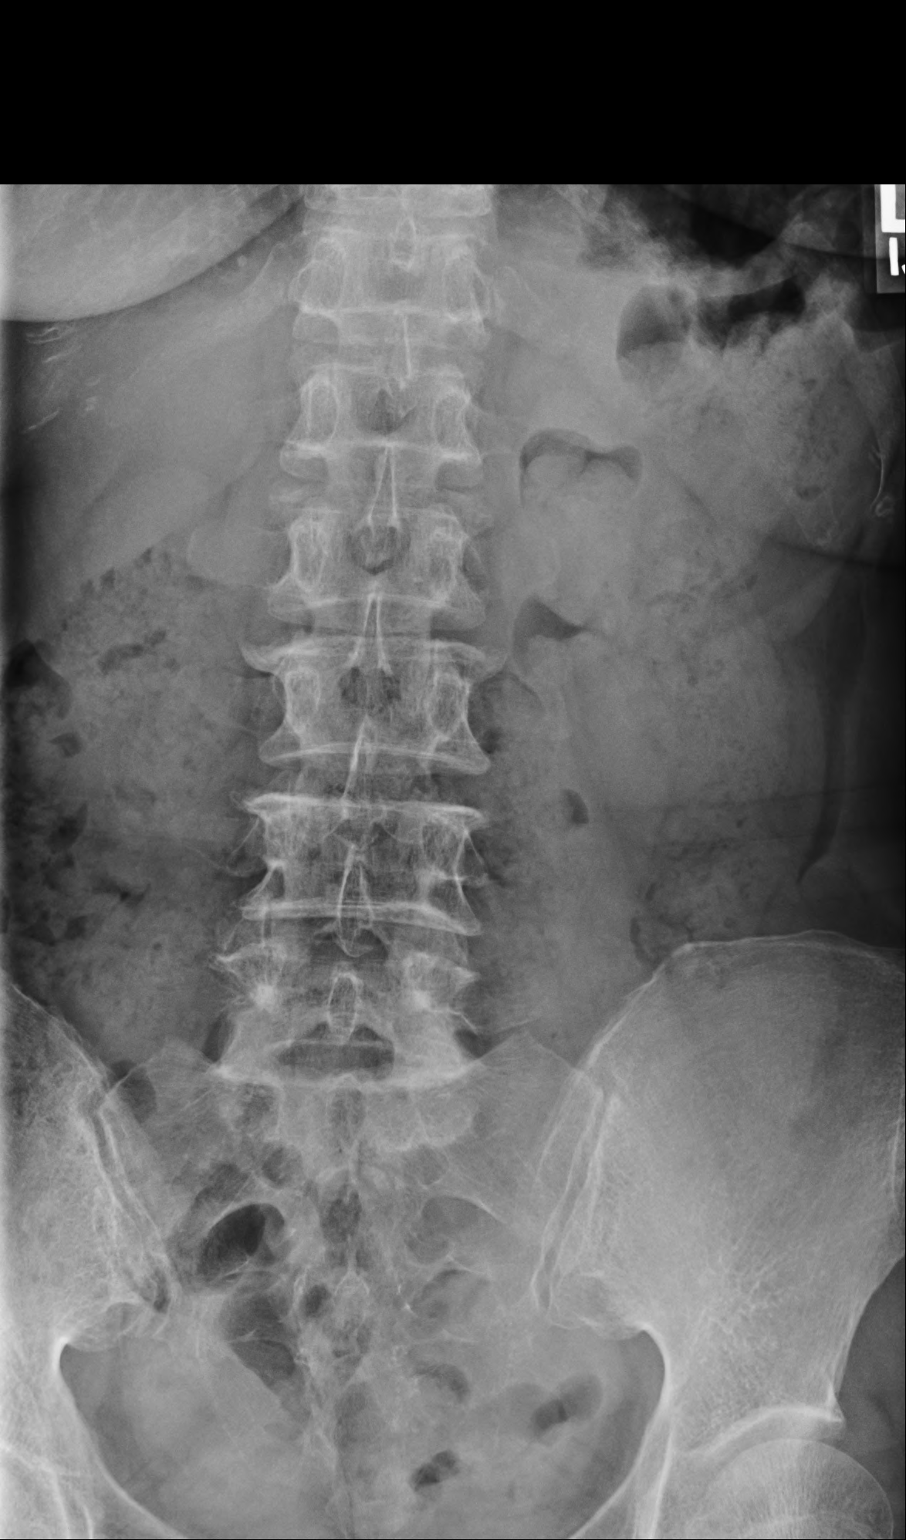
[im 2/4]
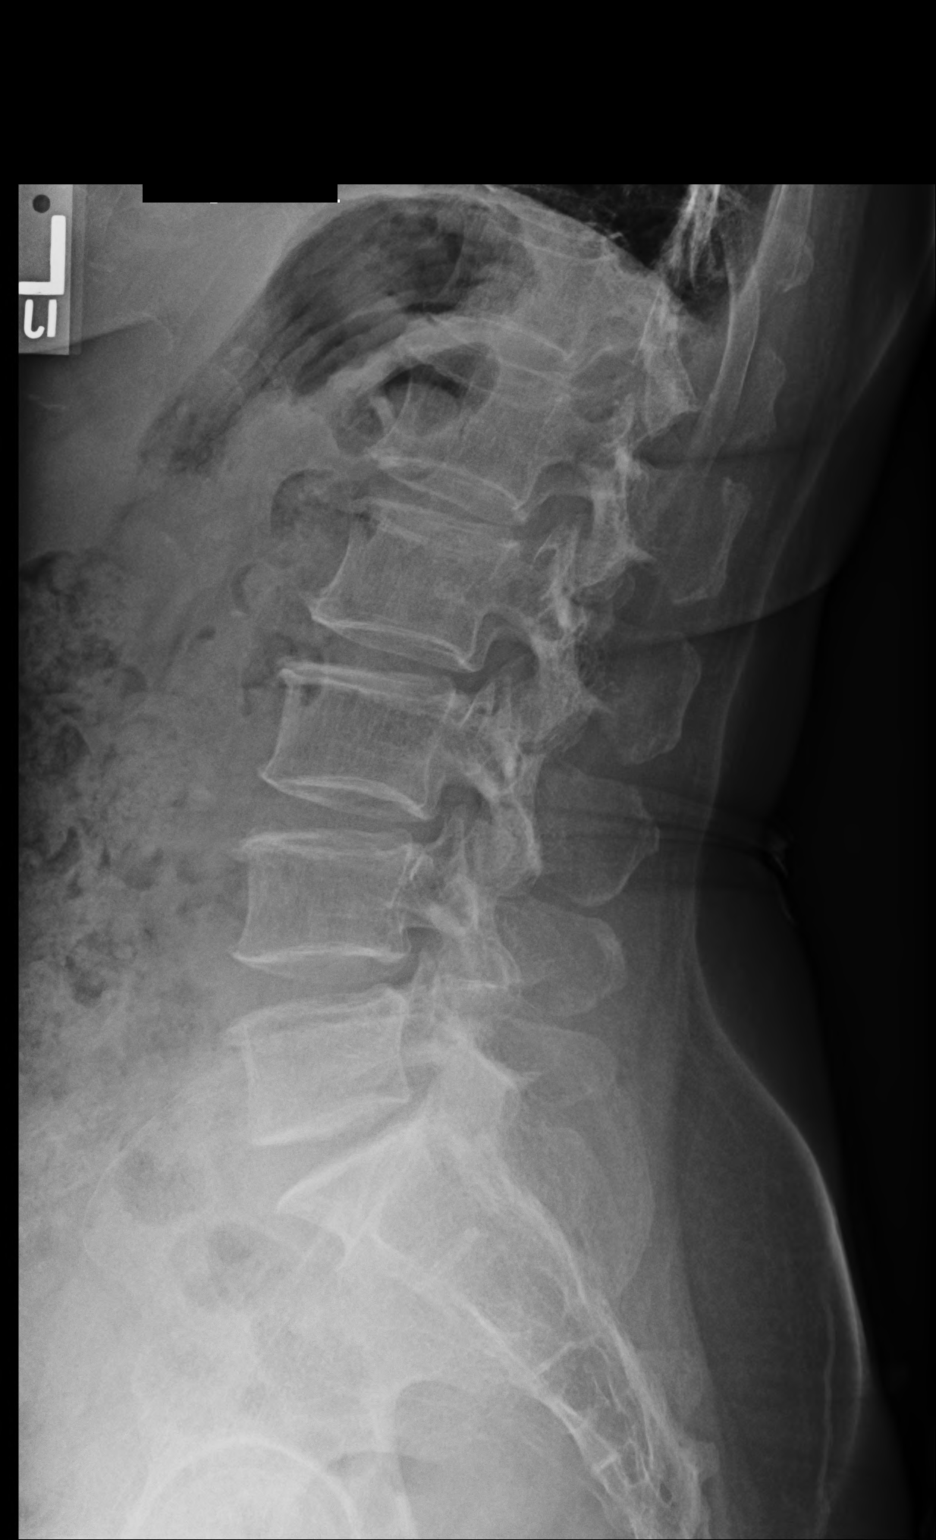
[im 3/4]
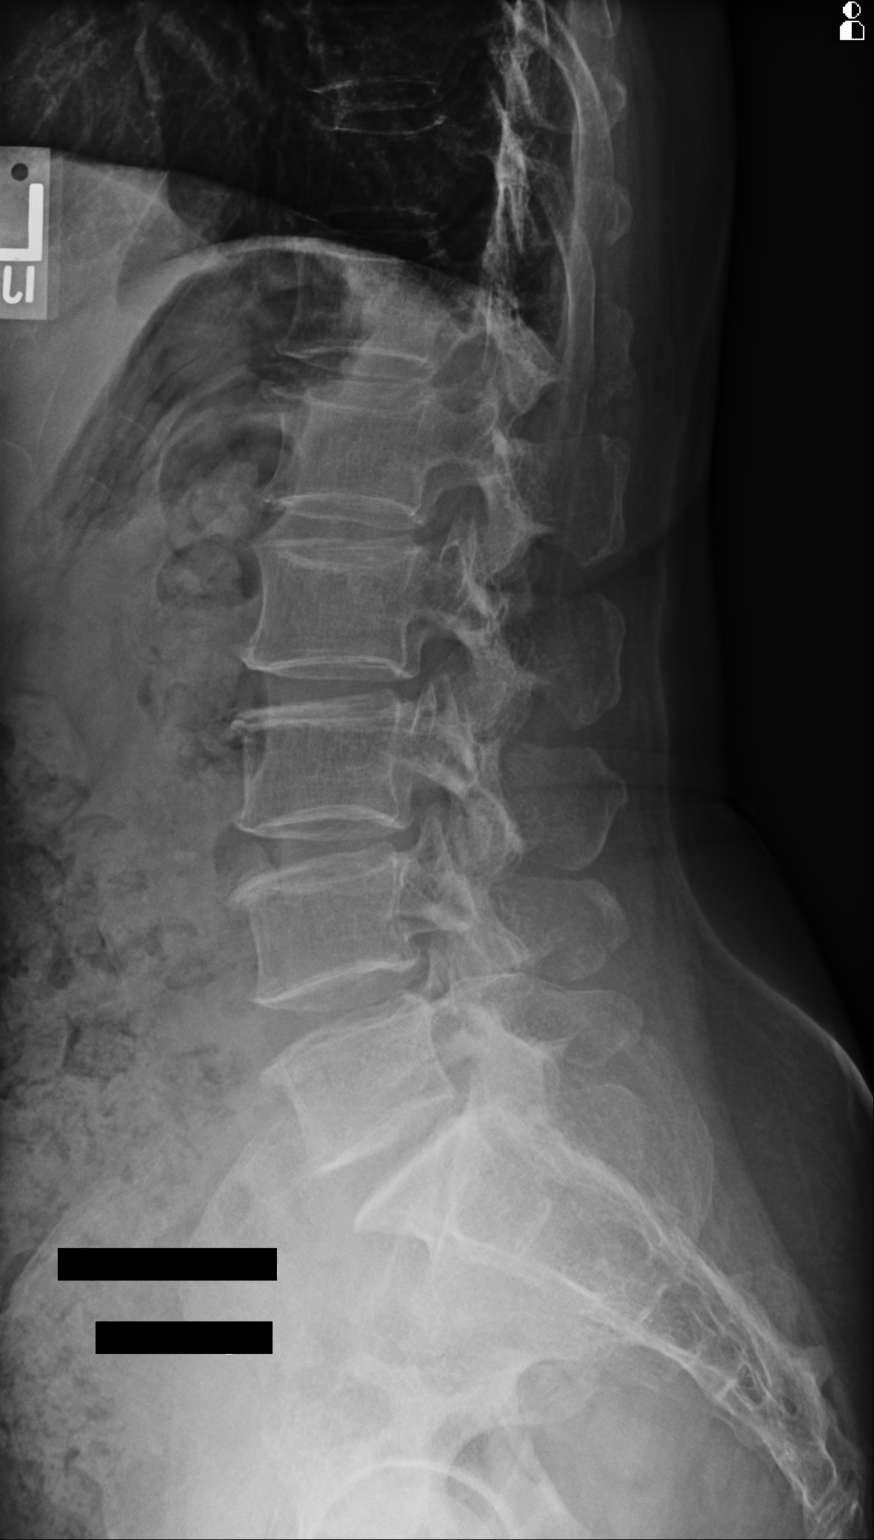
[im 4/4]
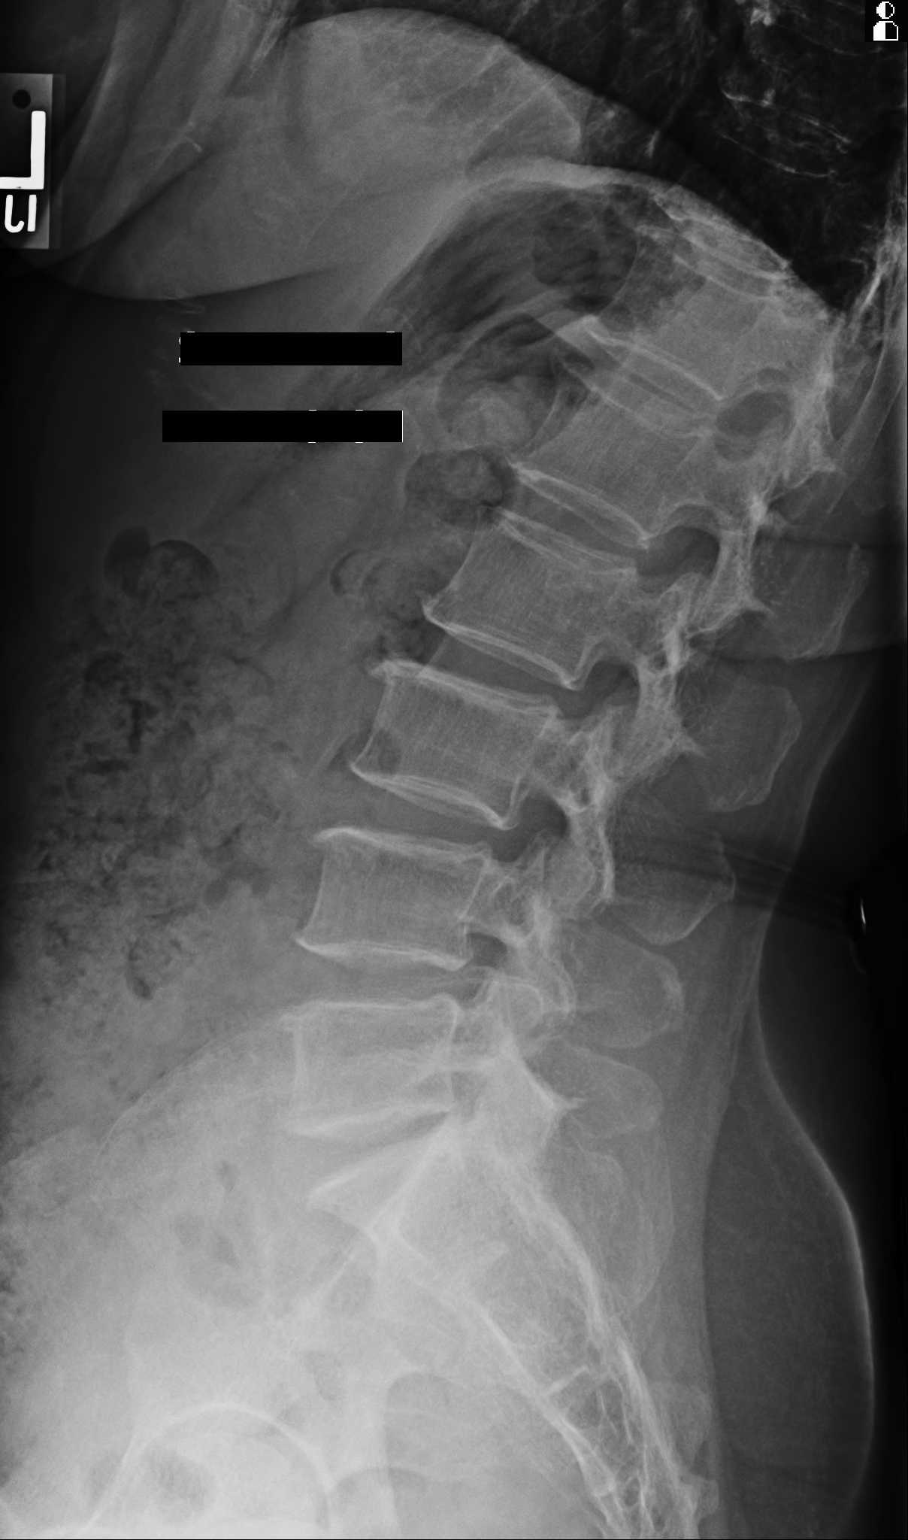

[4 of 4 positions shown; findings below may reference images not displayed]

FINDINGS: Osteopenia. No acute fracture. No pars defect. Mild lower lumbar spine facet arthrosis. Mild multilevel degenerative disc disease. SI joints are intact. No dynamic instability upon flexion and extension.
IMPRESSION: Mild multilevel lumbar spine degenerative changes.

## 2019-04-14 ENCOUNTER — Encounter: Payer: Self-pay | Admitting: Hospital

## 2019-06-30 ENCOUNTER — Encounter (INDEPENDENT_AMBULATORY_CARE_PROVIDER_SITE_OTHER): Payer: Self-pay

## 2019-11-30 ENCOUNTER — Encounter: Payer: Self-pay | Admitting: Family Medicine

## 2019-11-30 DIAGNOSIS — Z1231 Encounter for screening mammogram for malignant neoplasm of breast: Secondary | ICD-10-CM

## 2019-12-29 IMAGING — MG MAMMO SCRN BIL W/CAD TOMO
6 of 10 series · 6 of 26 positions shown · non-contrast
Comparison: No prior imaging studies are available for comparison.

Images Obtained from [facility]
INDICATION: Screening.
TECHNIQUE: Bilateral 2-D digital screening mammogram was performed followed by 3-D tomosynthesis.  Current study was also evaluated with a computer aided detection (CAD) system.
MAMMOGRAM FINDINGS:
The breasts are heterogeneously dense, which may obscure small masses.
There is a focal asymmetry in the right breast at 12 o'clock at posterior depth.
In the left breast, no suspicious masses, calcifications or other abnormalities are seen.

[R CC (1 of 2)]
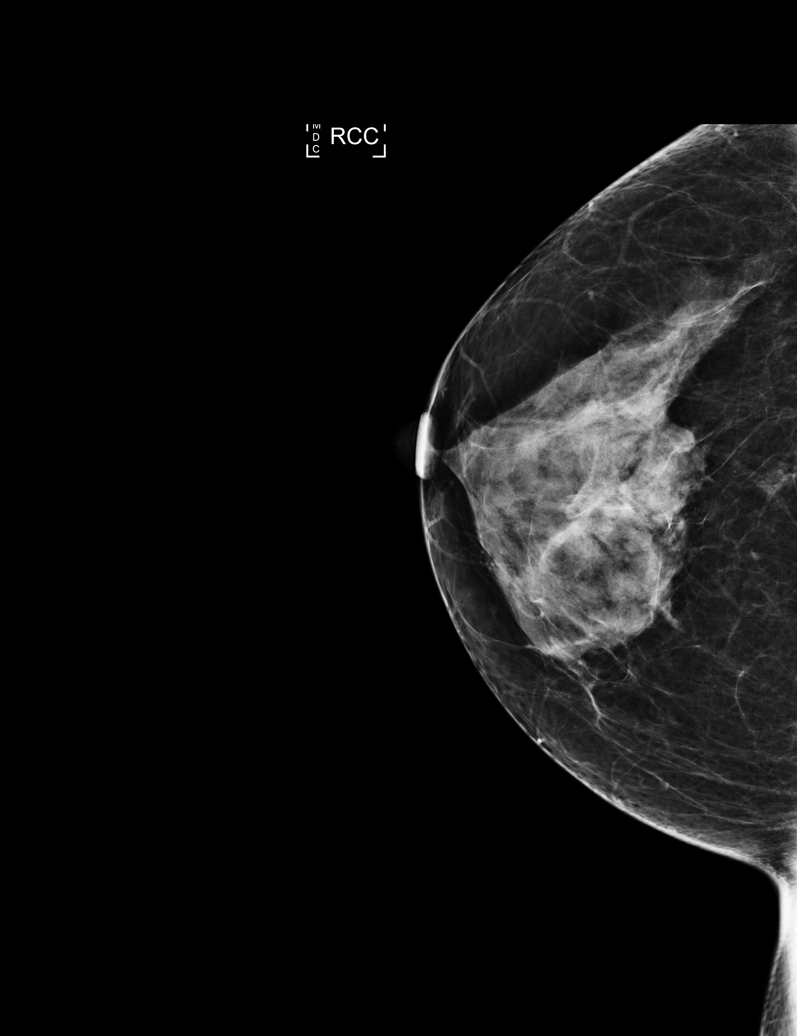

[R MLO (1 of 2)]
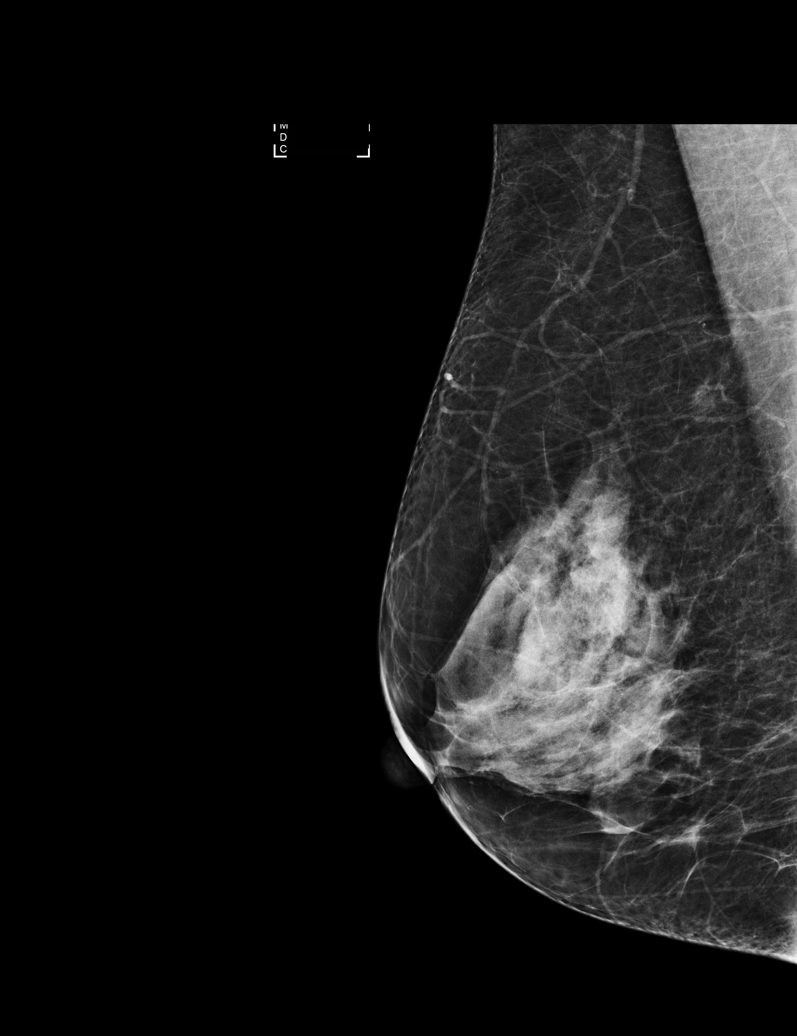

[L CC]
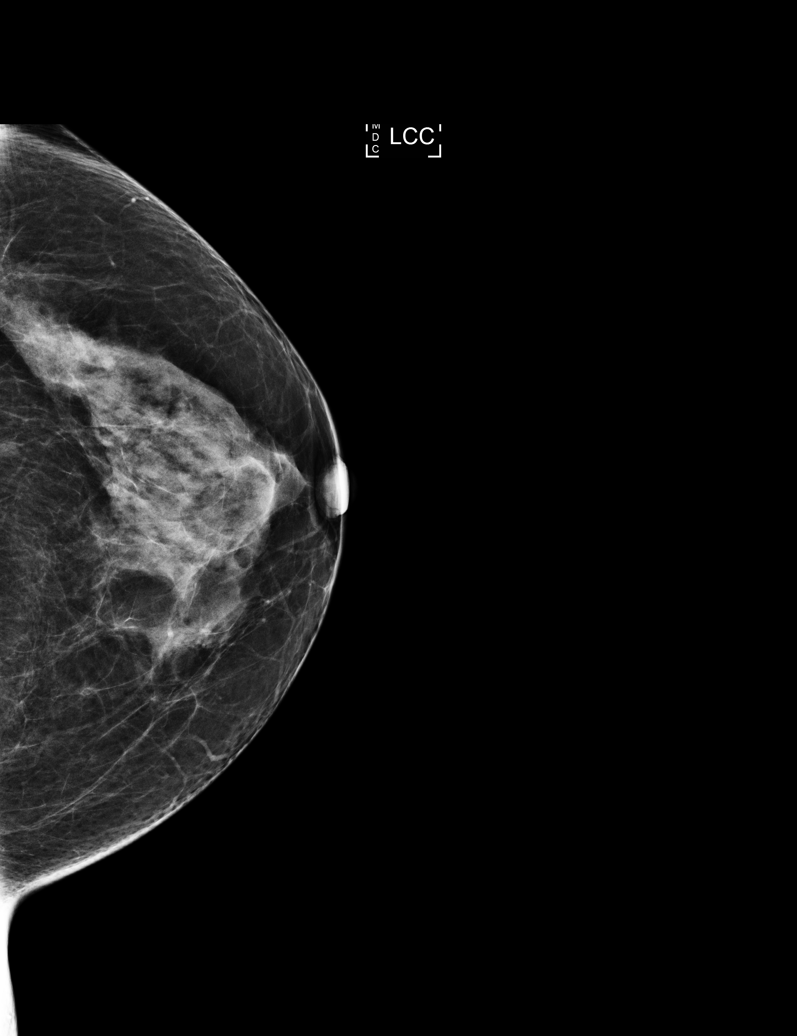

[L MLO]
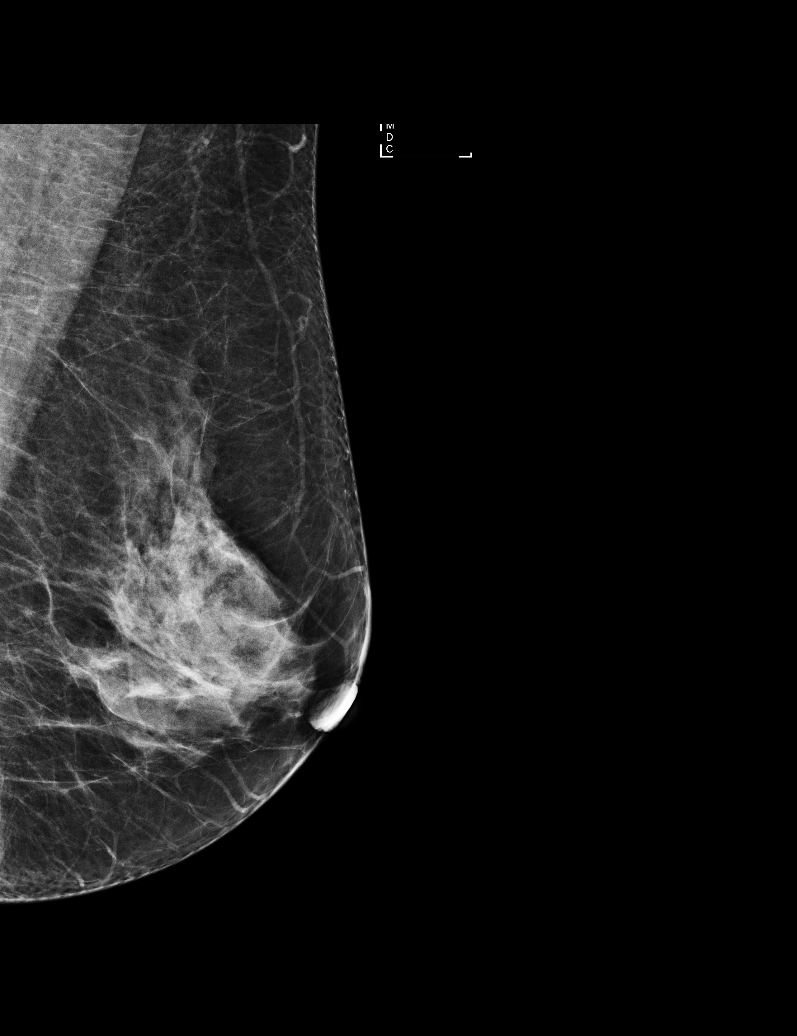

[R MLO (2 of 2)]
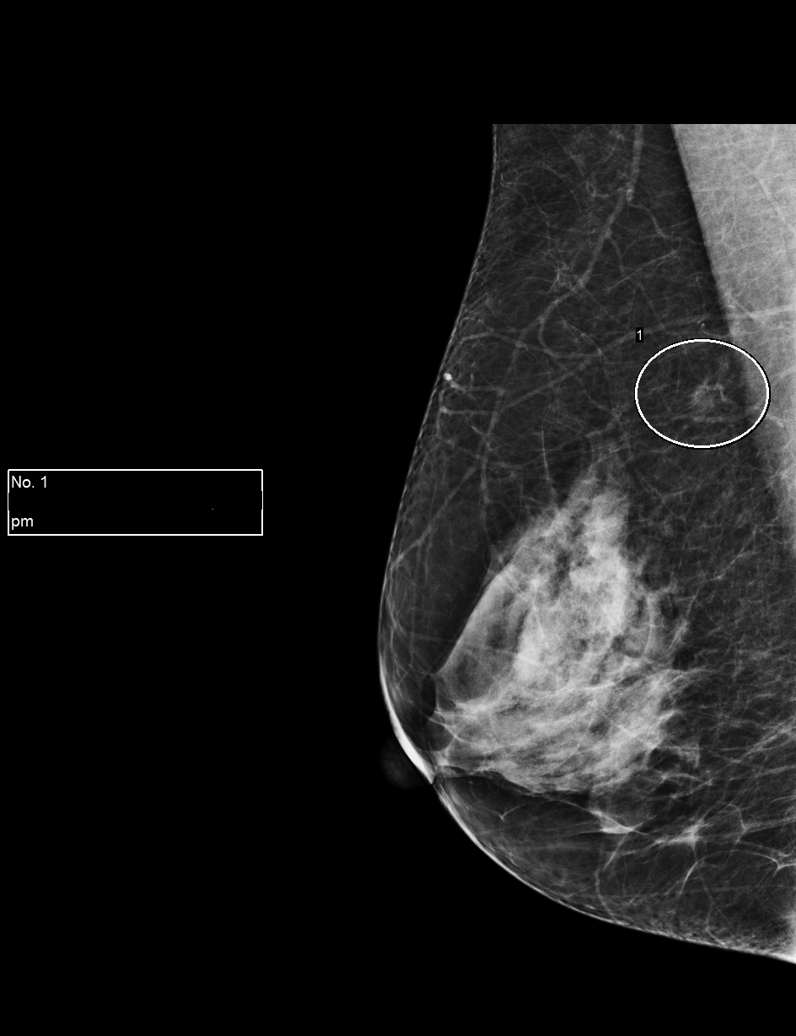

[R CC (2 of 2)]
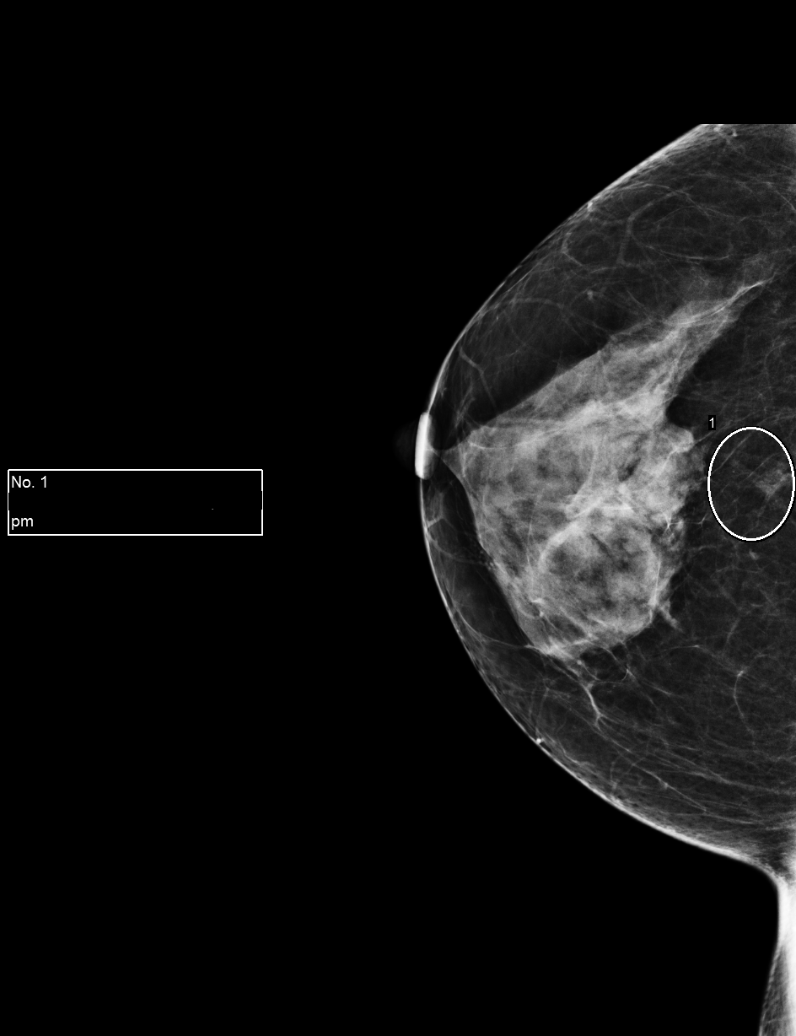

[6 of 26 positions shown; findings below may reference images not displayed]

IMPRESSION: Focal asymmetry in the right breast requires additional evaluation. Diagnostic mammogram with possible ultrasound recommended.
BI-RADS Category 0: Incomplete: Needs Additional Imaging Evaluation

## 2019-12-29 IMAGING — US US ABDOMEN COMPLETE
1 series · 13 of 25 positions shown · non-contrast
Comparison: None.

HISTORY: 59 year old female with epigastric pain, nausea
TECHNIQUE: Gray-scale and color and spectral Doppler ultrasound imaging of the abdomen was performed.

[Series 1: us abdomen complete · 13 of 63 slices shown]
[im 1/63]
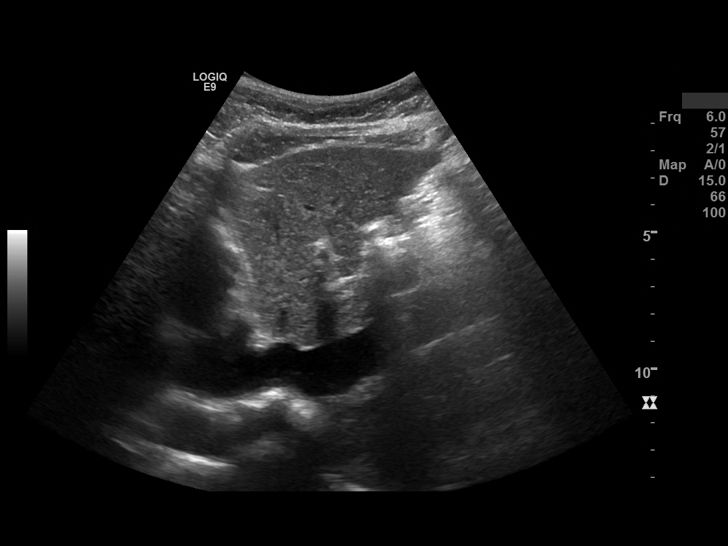
[im 6/63]
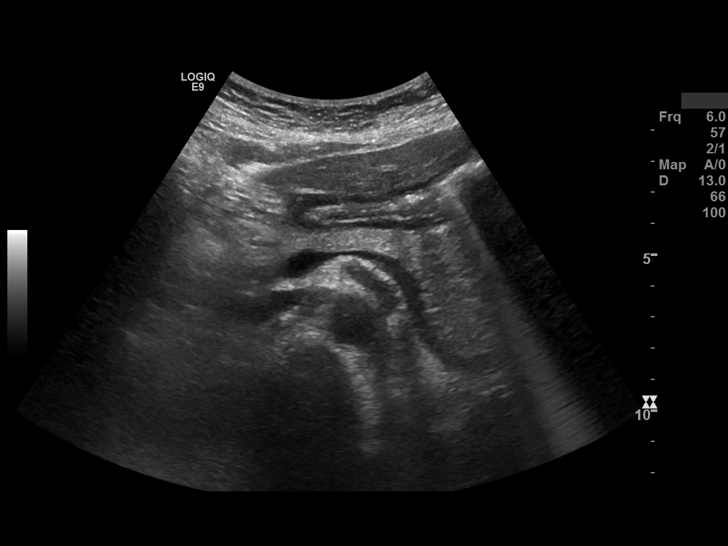
[im 11/63]
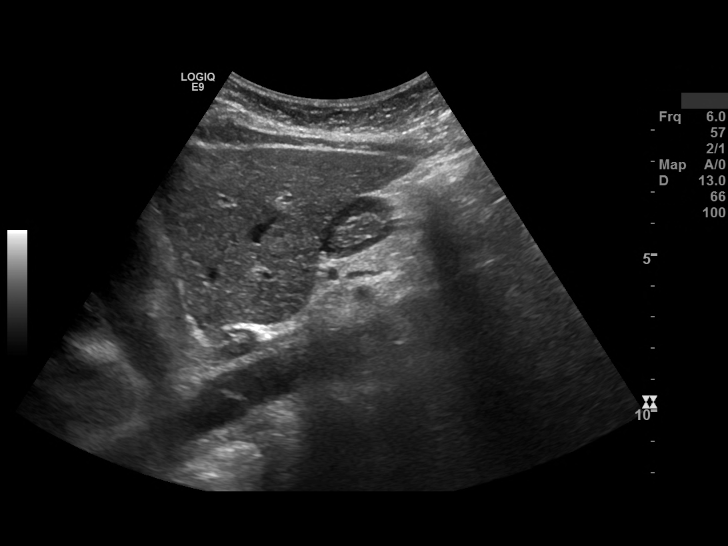
[im 16/63]
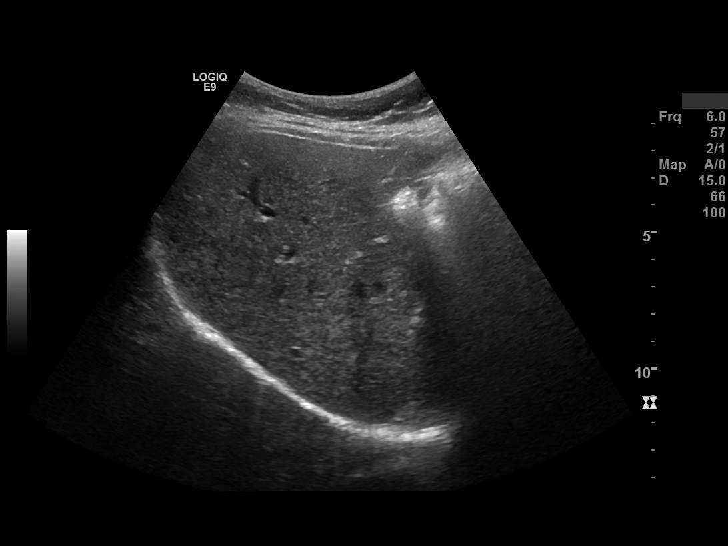
[im 21/63]
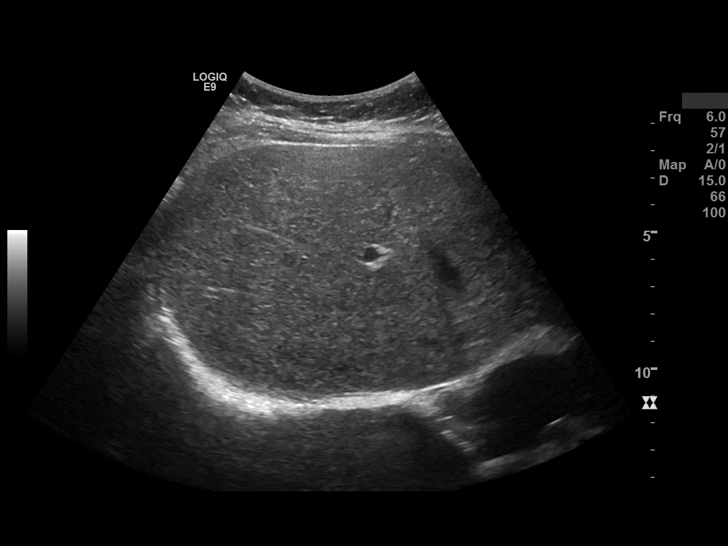
[im 26/63]
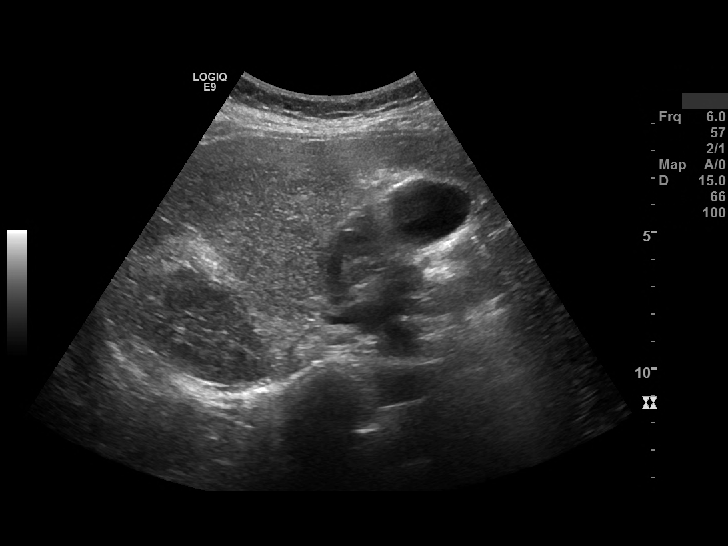
[im 32/63]
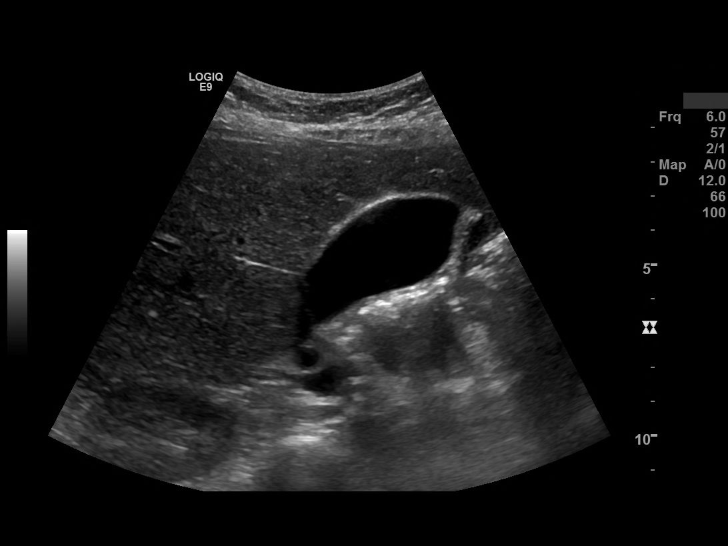
[im 37/63]
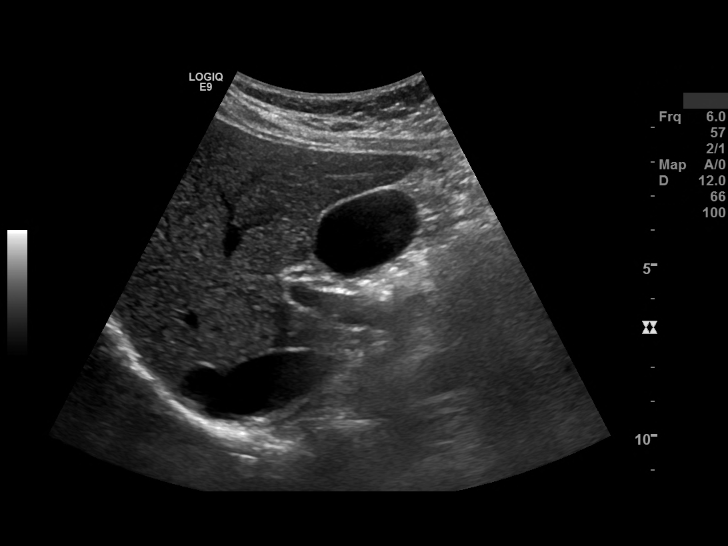
[im 42/63]
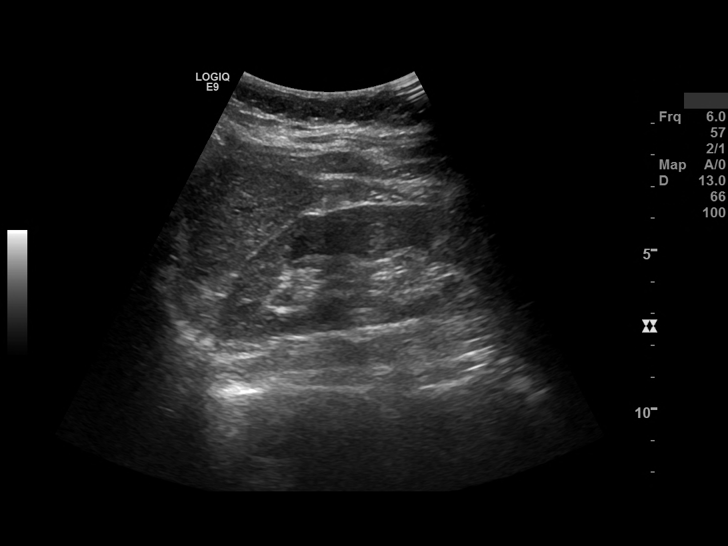
[im 47/63]
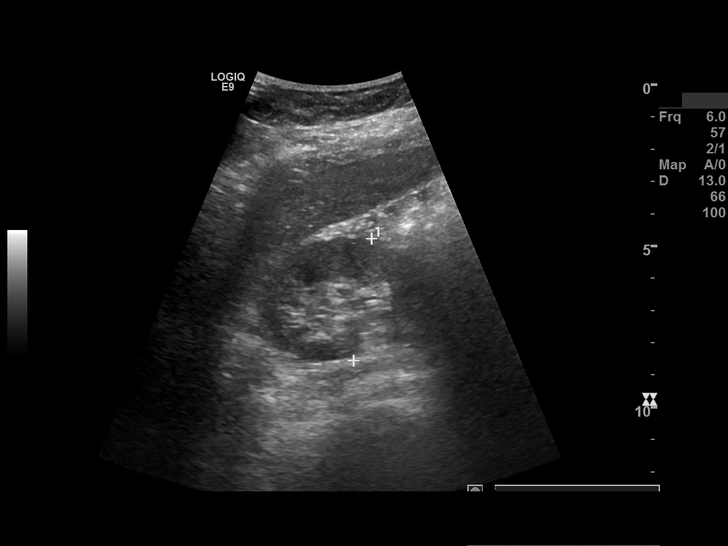
[im 52/63]
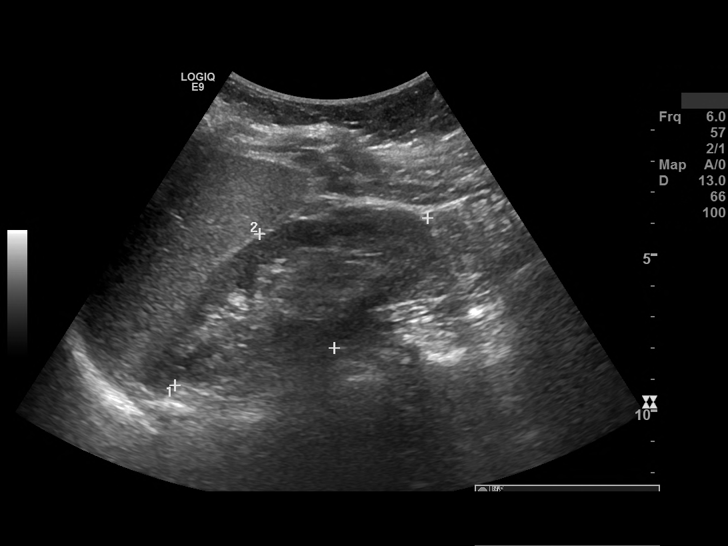
[im 57/63]
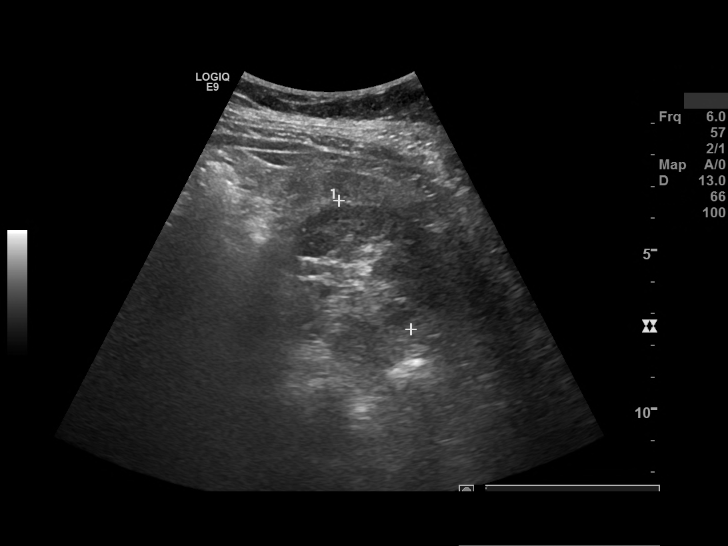
[im 63/63]
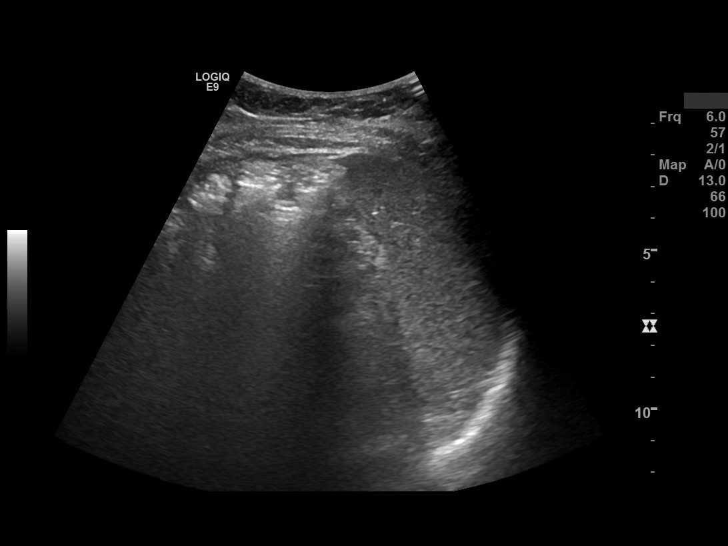

[13 of 25 positions shown; findings below may reference images not displayed]

FINDINGS: Image quality: Satisfactory.

PANCREAS: 

Partially imaged without significant abnormality.

LIVER: 

*
Size: Normal in size, measuring 133 mm in length. 

*
Parenchymal Echogenicity: Increased

*
Parenchymal Echotexture: Coarsened. 

*
Surface: Smooth. 

*
Focal Hepatic Lesions: None.. 

*
Portal Vein: Patent with appropriate direction of flow and normal spectral waveform.

BILIARY DUCTAL SYSTEM: 

*
Dilation: No intra- or extrahepatic biliary ductal dilation.  

*
Common Bile Duct: Measures 3.87 mm in greatest diameter. 

GALLBLADDER: 

*
Size: Normal.

*
Gallbladder Wall: No gallbladder wall thickening.

*
Cholelithiasis/Sludge: None identified.

*
Pericholecystic Fluid: None identified.

*
Sonographic Murphy's Sign: Negative per sonographer.

SPLEEN: 

*
Size: Normal, measuring 99 x 36 x34 mm 

*
Echogenicity: Normal

VASCULATURE:

*
Aorta: Unremarkable.

*
IVC: Unremarkable.

KIDNEYS: 

RIGHT:

*
Size: Normal, measuring 93 mm in length.

*
Cortical Echogenicity: Normal.

*
Parenchymal Thickness: Preserved.

*
Stones: None.

*
Hydronephrosis: None.

LEFT:

*
Size: Normal, measuring 97 mm in length.

*
Cortical Echogenicity: Normal.

*
Parenchymal Thickness: Preserved.

*
Stones: None.

*
Hydronephrosis: None.

PERITONEUM: 

No free fluid is identified within the provided images.
IMPRESSION: Abnormal hepatic parenchymal echogenicity and echotexture could be related to hepatosteatosis and/or other chronic liver disease.

## 2020-01-05 IMAGING — MR MRI LSPINE WO CONTRAST
4 of 5 series · 31 of 48 positions shown · non-contrast
Comparison: MRI lumbar spine 11/25/2018

HISTORY: Low back pain
TECHNIQUE: Routine multiplanar MRI of the lumbar spine was performed without IV contrast.

[Series 16: t2_sag · sagittal · 4.0mm · 0.74mm/px · 7 of 19 slices shown]
[im 1/19]
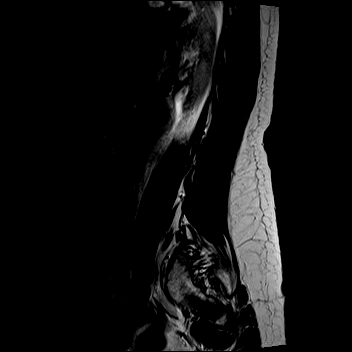
[im 4/19]
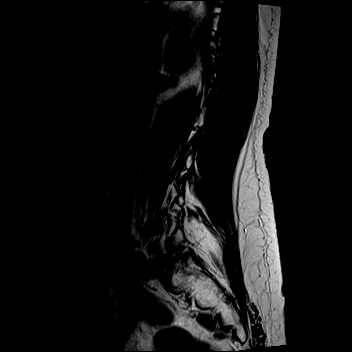
[im 7/19]
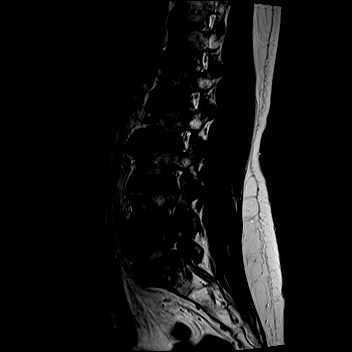
[im 10/19]
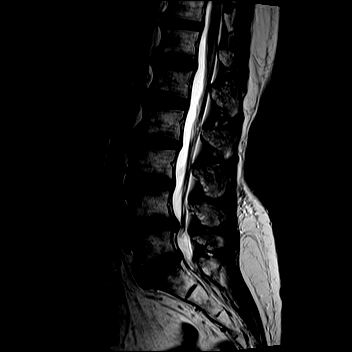
[im 13/19]
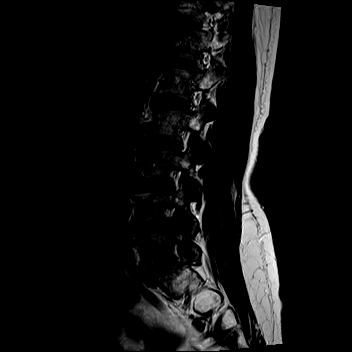
[im 16/19]
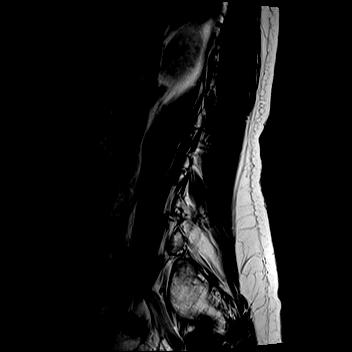
[im 19/19]
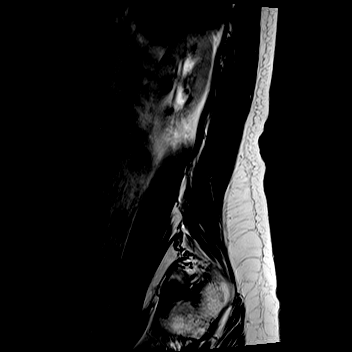

[Series 17: t1_sag · sagittal · 4.0mm · 0.81mm/px · 7 of 19 slices shown]
[im 1/19]
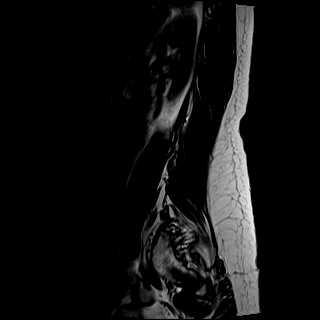
[im 4/19]
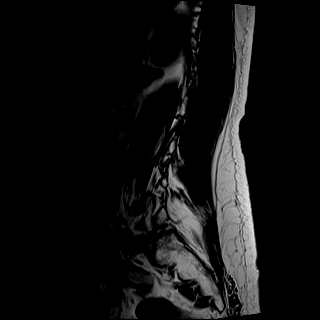
[im 7/19]
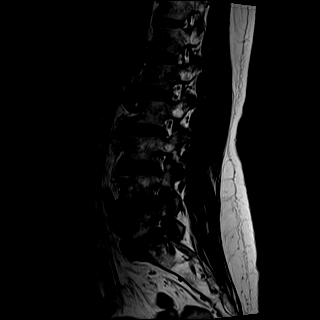
[im 10/19]
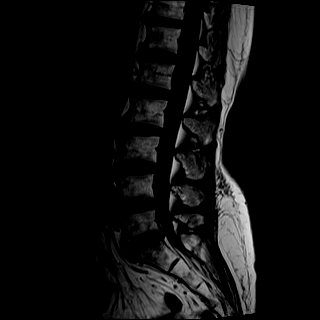
[im 13/19]
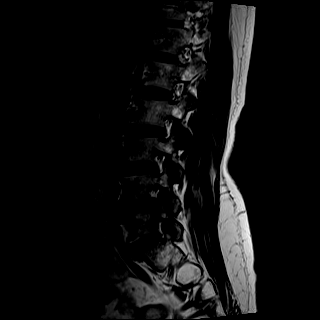
[im 16/19]
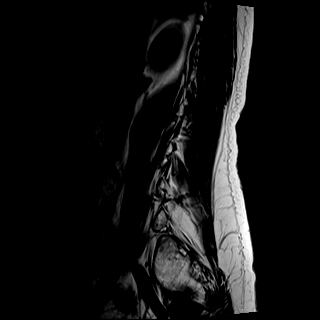
[im 19/19]
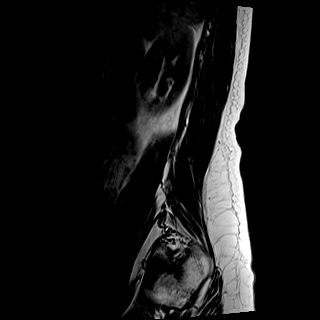

[Series 18: ir_sag · sagittal · 4.0mm · 0.45mm/px · 7 of 19 slices shown]
[im 1/19]
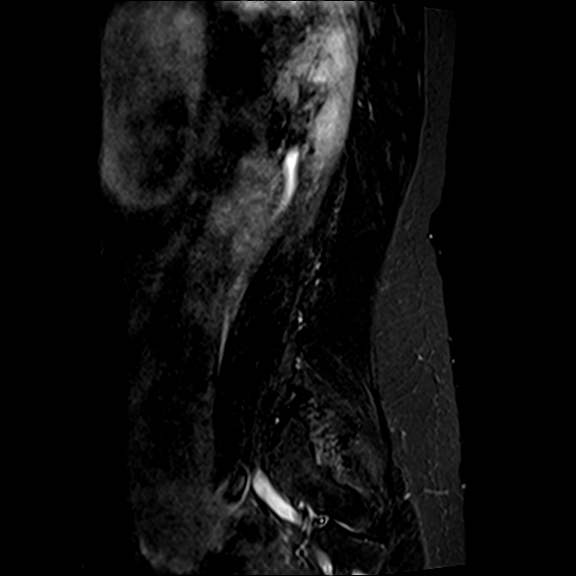
[im 4/19]
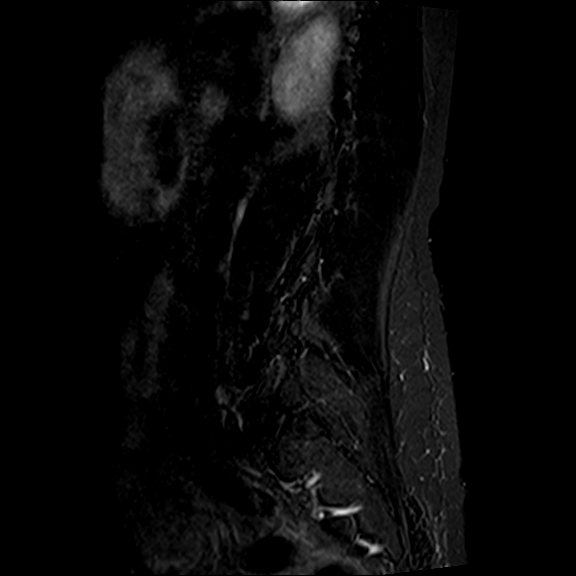
[im 7/19]
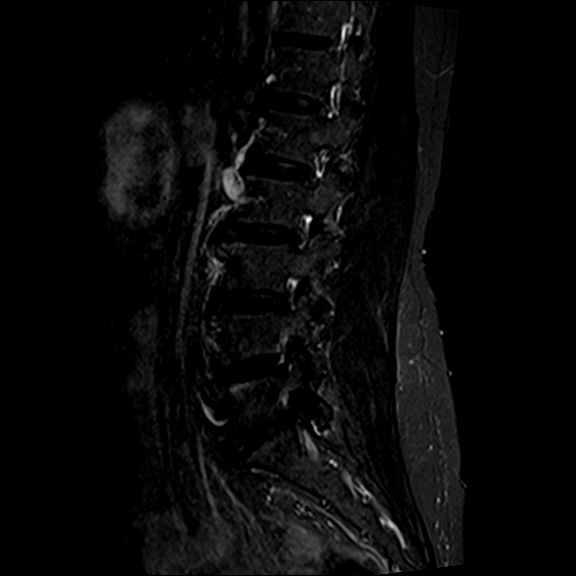
[im 10/19]
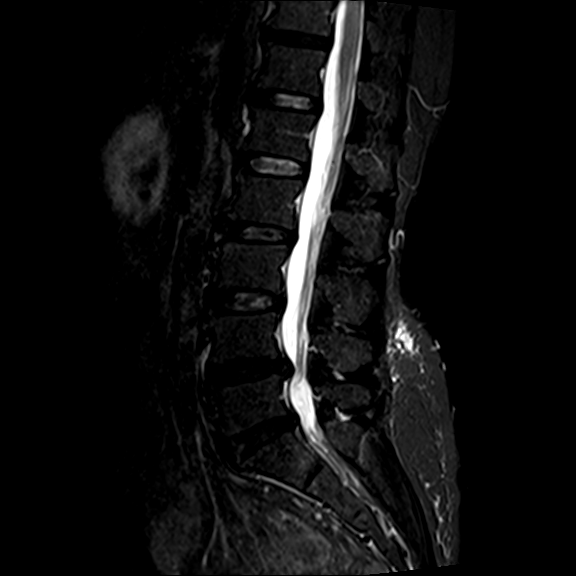
[im 13/19]
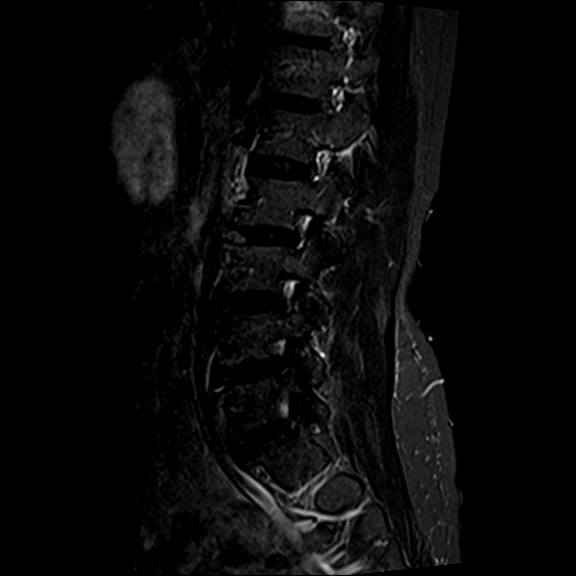
[im 16/19]
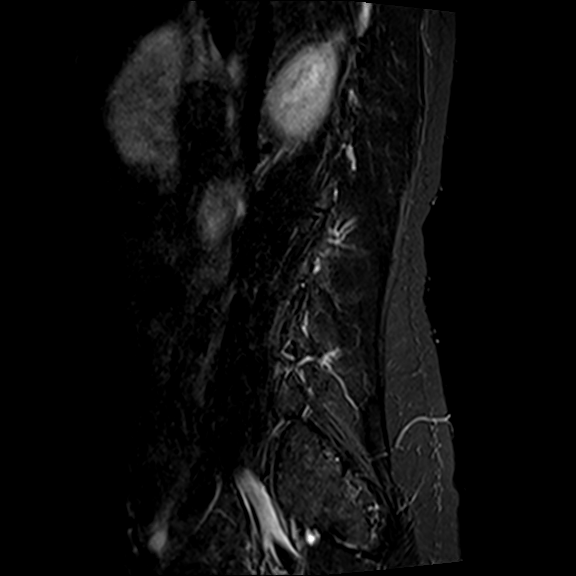
[im 19/19]
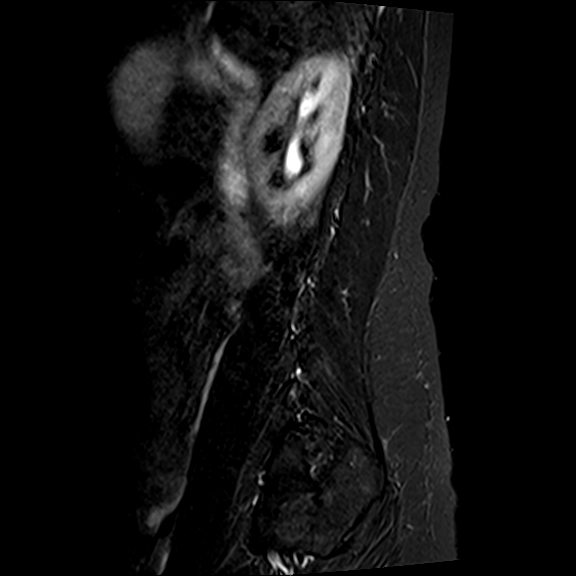

[Series 19: t2_axial · axial · 4.0mm · 0.62mm/px · z∈[-451,-284]mm · 10 of 44 slices shown]
[im 3/44]
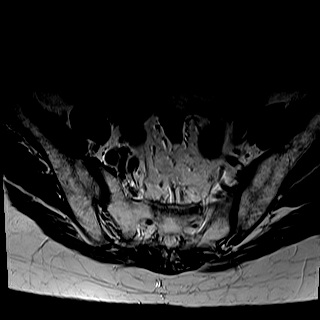
[im 6/44]
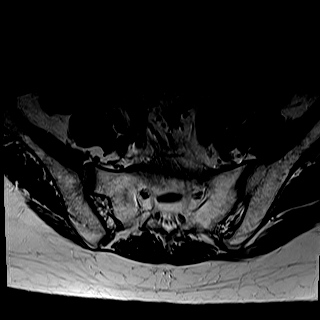
[im 9/44]
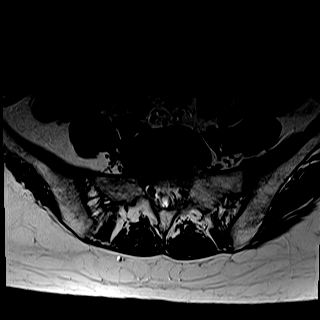
[im 14/44]
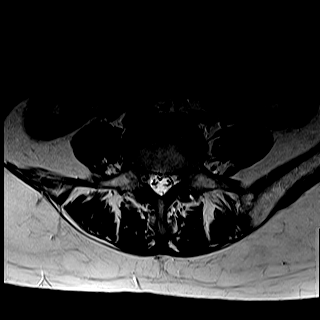
[im 19/44]
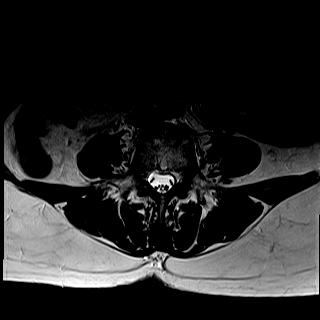
[im 22/44]
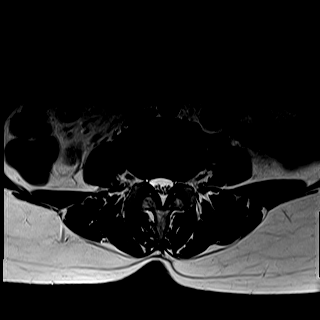
[im 25/44]
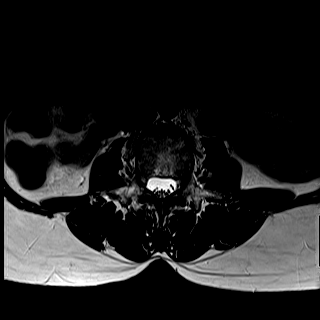
[im 30/44]
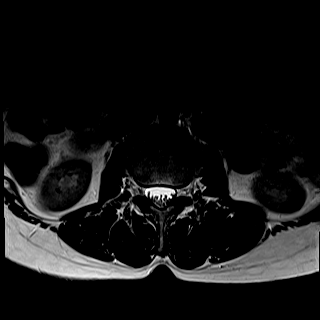
[im 35/44]
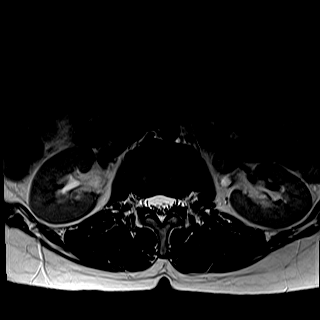
[im 38/44]
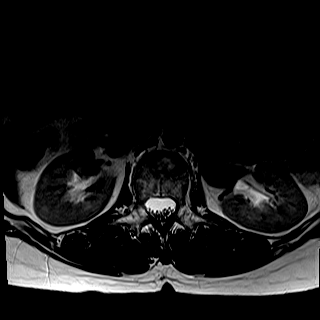

[31 of 48 positions shown; findings below may reference images not displayed]

FINDINGS: Lumbar segments maintain appropriate alignment. No fractures. No destructive lesions. Multilevel degenerative disc desiccation, loss of disc space height and diffuse disc bulging. Superimposed protrusion L4-5. No abnormal signal in an intradural or paraspinous position. Conus medullaris terminates normally at L1 and maintains normal signal. Cauda equina unremarkable.

L1-2: Minimal disc bulge. Mild facet arthropathy. No canal or foraminal stenosis.

L2-3: Mild diffuse disc bulge. Mild facet arthropathy. No canal stenosis. No significant foraminal stenosis.

L3-4: Mild diffuse disc bulge. Mild facet arthropathy. No canal stenosis. Mild foraminal stenoses.

L4-5: Moderate diffuse disc bulge. Superimposed broad-based central/left paracentral protrusion measuring approximately 3 mm in AP dimension, similar to prior. Bilateral facet arthropathy and ligamentum flavum hypertrophy. Severe canal stenosis, similar to prior. Moderate left foraminal stenosis and mild right foraminal stenosis.

L5-S1: Moderate diffuse disc bulge, more prominent to the left. Bilateral facet arthropathy. Mild canal stenosis. Mild right foraminal stenosis and moderate left foraminal stenosis.
IMPRESSION: 1. Stable central/left paracentral disc protrusion superimposed upon moderate diffuse disc bulge at L4-5 producing severe canal stenosis, unchanged. Moderate left foraminal stenosis L4-5.

2. Moderate left foraminal stenosis L5-S1 secondary to asymmetric disc bulge L5-S1. Mild canal stenosis at L5-S1.

3. No fractures. No destructive lesions.

## 2020-01-13 IMAGING — MG MAMMO DIAG RT W/TOMO
8 series · 9 of 24 positions shown · non-contrast
Comparison: Multiple prior exams most recently 12/29/2019

INDICATION: Right breast asymmetry.
TECHNIQUE: Right breast 2-D digital diagnostic mammogram was performed followed by 3-D tomosynthesis.

[R LM]
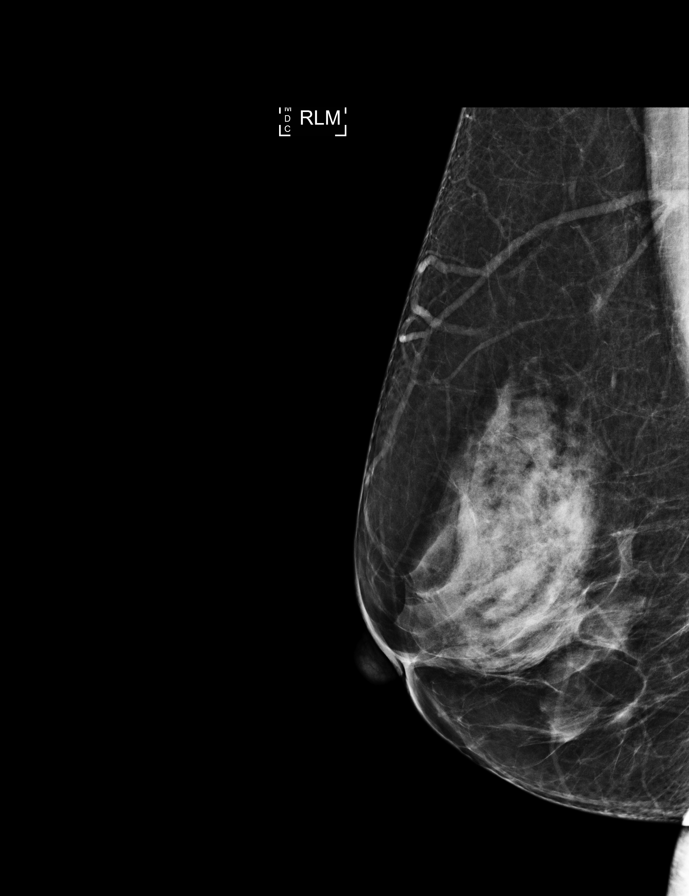

[R CC (1 of 2)]
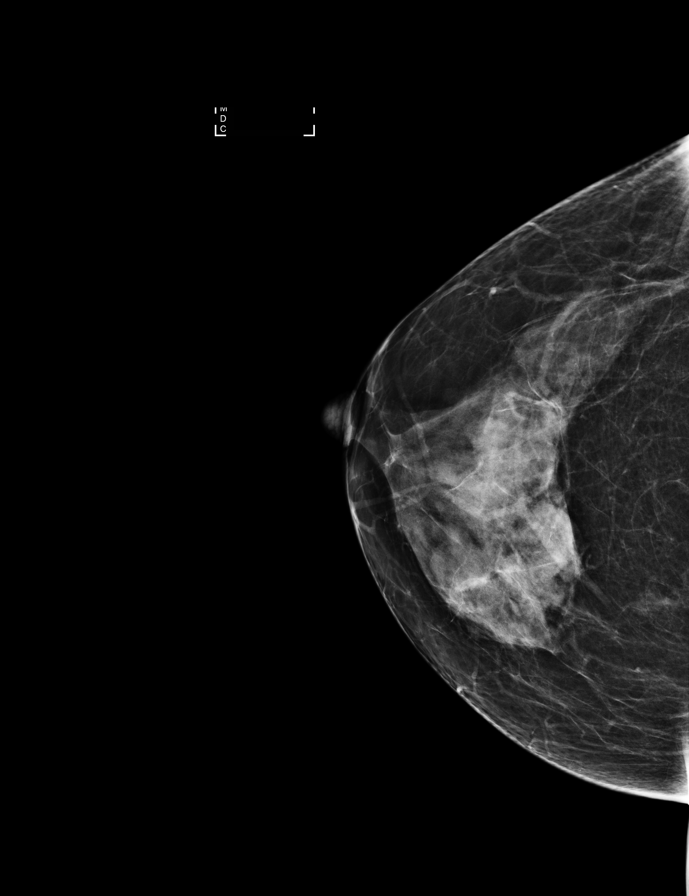

[R CC (2 of 2)]
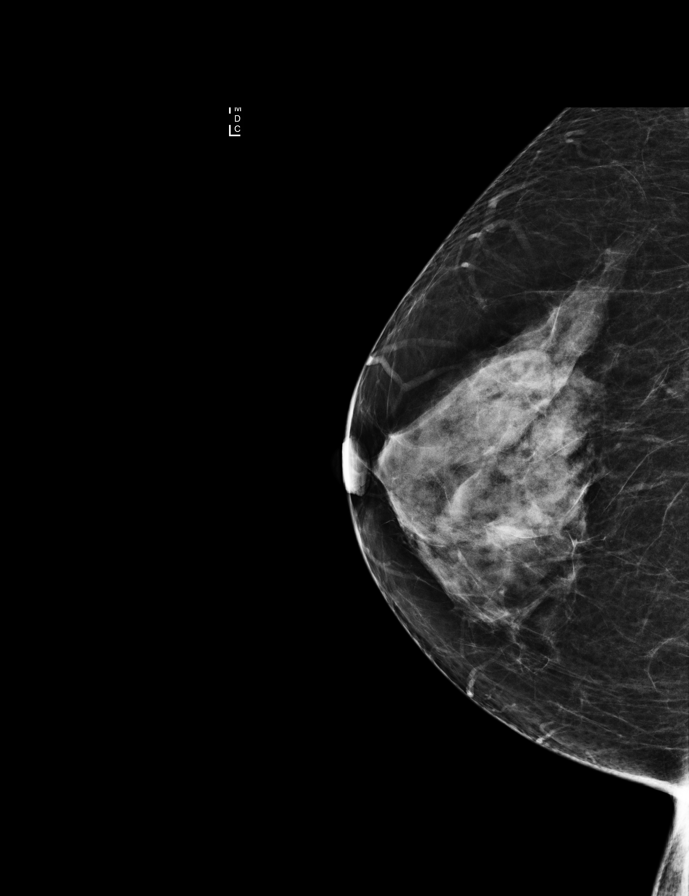

[R MLO]
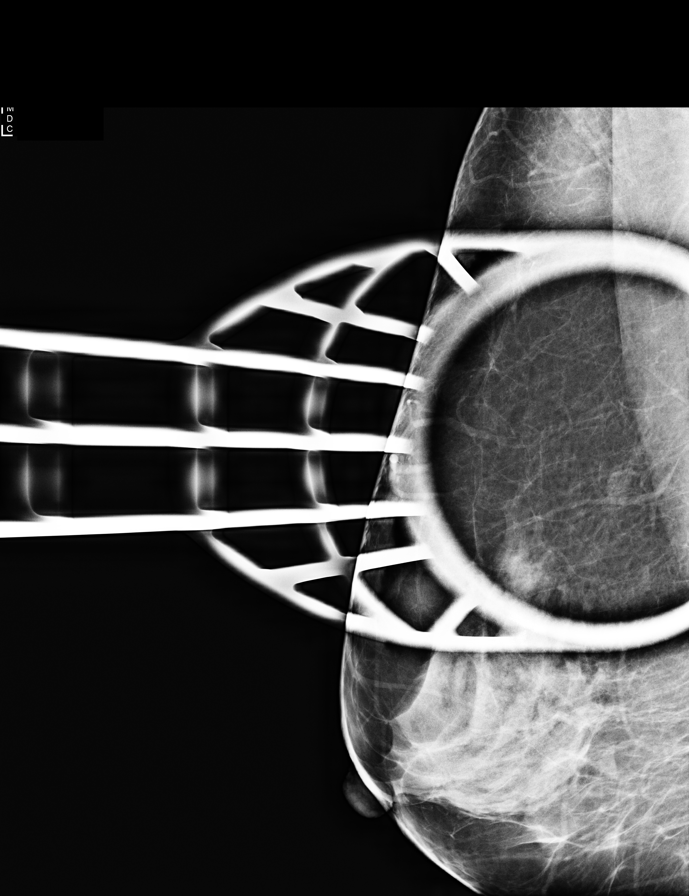

[R MLO tomo · 2 of 54 frames shown]
[frame 18/54]
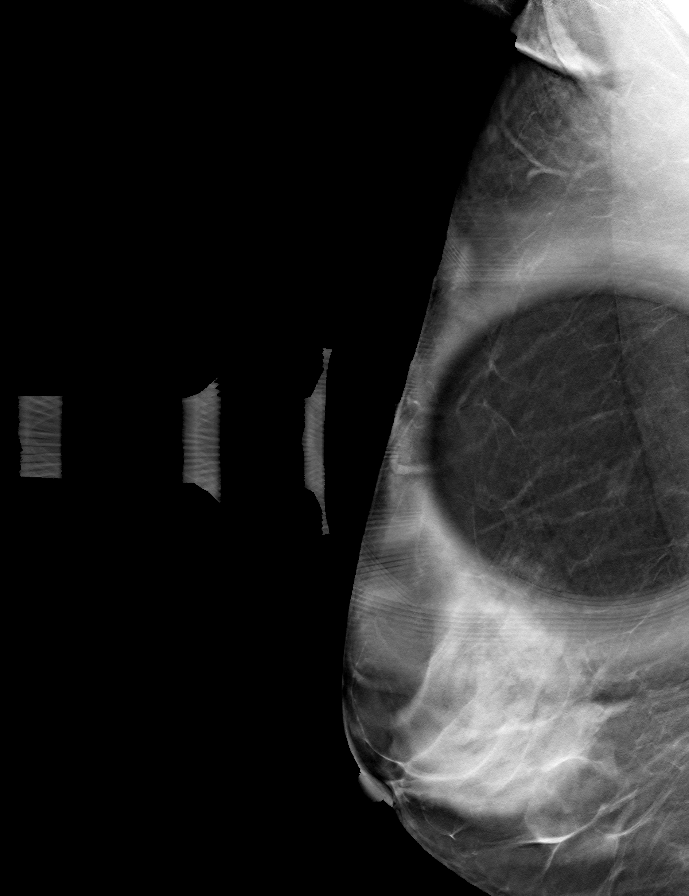
[frame 27/54]
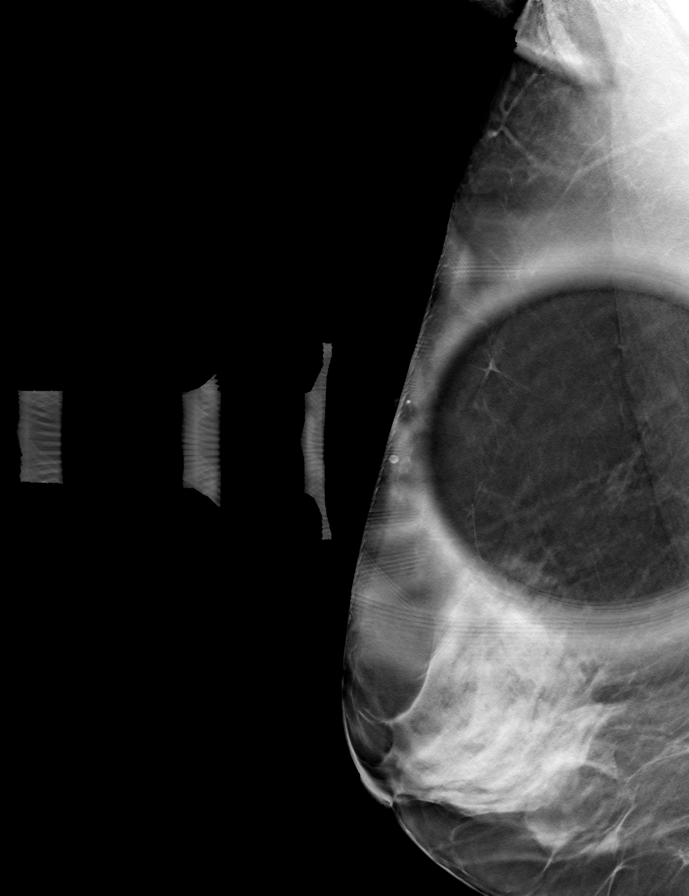

[R CC tomo (1 of 2) · tomo slice 31/61.0]
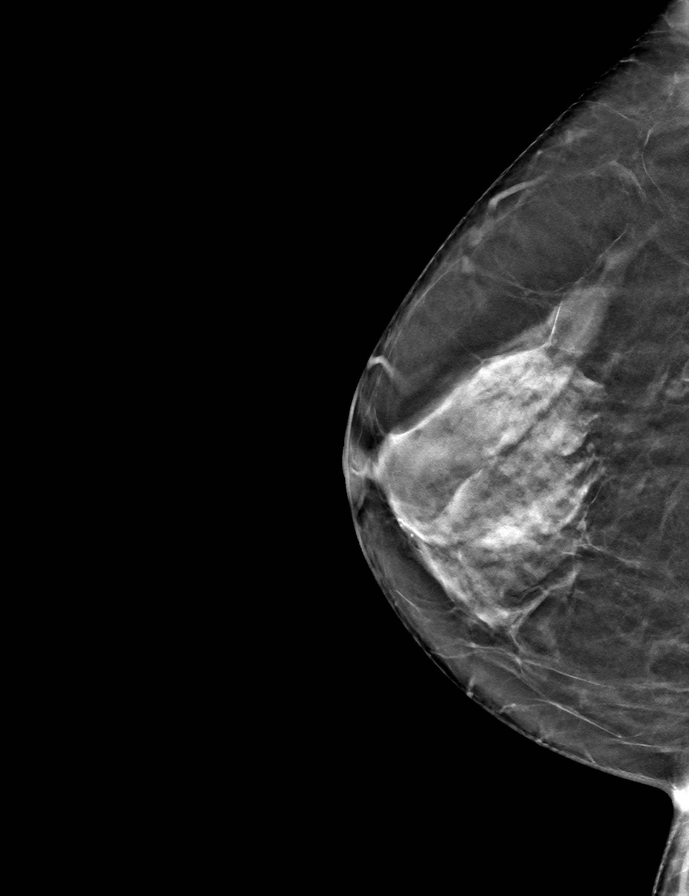

[R CC tomo (2 of 2) · tomo slice 37/72.0]
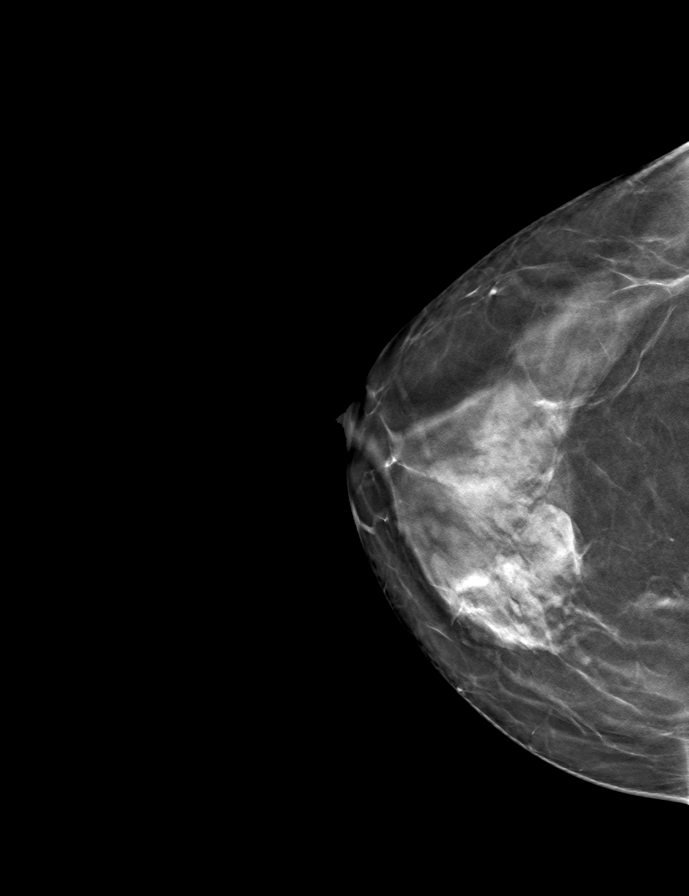

[R LM tomo · tomo slice 29/58.0]
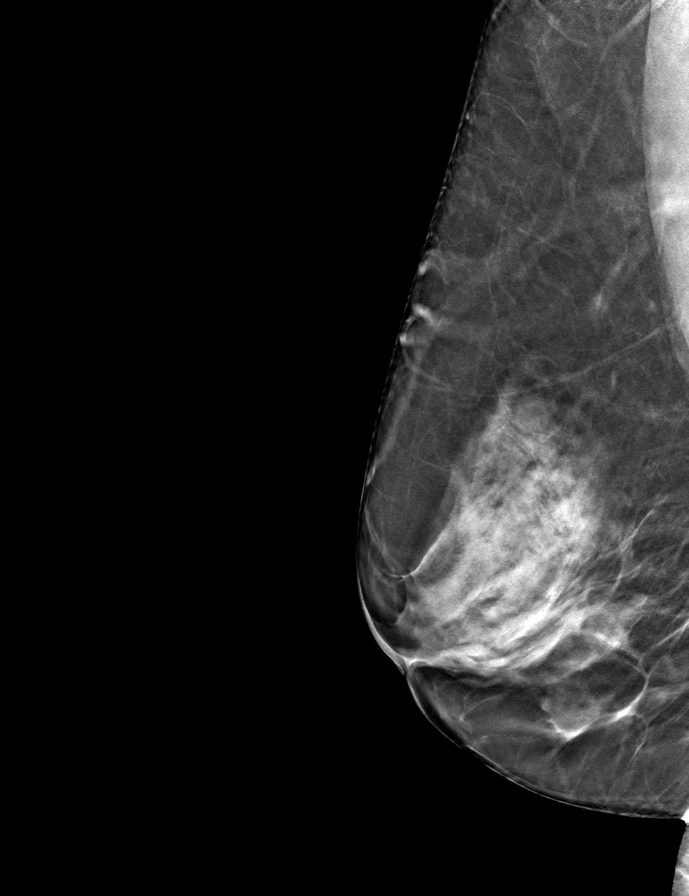

[9 of 24 positions shown; findings below may reference images not displayed]

FINDINGS: The breast is extremely dense, which lowers the sensitivity of mammography. Additional views confirm a stable focal asymmetry in the right breast 12 o'clock posterior depth. This is stable over multiple prior exams which were not available at the time the recent screening exam was read. No suspicious abnormality is seen in the breast.
IMPRESSION: There is no mammographic evidence of malignancy. A one year screening mammogram is recommended.

The patient received a copy of the results at the end of the examination. 

FINAL ASSESSMENT: BI-RADS: Category 2 Benign

## 2020-09-06 ENCOUNTER — Encounter (INDEPENDENT_AMBULATORY_CARE_PROVIDER_SITE_OTHER): Payer: Self-pay | Admitting: Hospital
